# Patient Record
Sex: Male | Born: 1960 | Race: White | Hispanic: No | Marital: Married | Smoking: Former smoker
Health system: Southern US, Community
[De-identification: ages and names within clinical notes are randomized; demographics above are authoritative.]

## PROBLEM LIST (undated history)

## (undated) DIAGNOSIS — S2220XA Unspecified fracture of sternum, initial encounter for closed fracture: Secondary | ICD-10-CM

## (undated) DIAGNOSIS — M1990 Unspecified osteoarthritis, unspecified site: Secondary | ICD-10-CM

## (undated) DIAGNOSIS — K859 Acute pancreatitis without necrosis or infection, unspecified: Secondary | ICD-10-CM

## (undated) DIAGNOSIS — E119 Type 2 diabetes mellitus without complications: Secondary | ICD-10-CM

## (undated) DIAGNOSIS — R569 Unspecified convulsions: Secondary | ICD-10-CM

## (undated) DIAGNOSIS — K5792 Diverticulitis of intestine, part unspecified, without perforation or abscess without bleeding: Secondary | ICD-10-CM

## (undated) HISTORY — DX: Unspecified osteoarthritis, unspecified site: M19.90

## (undated) HISTORY — DX: Type 2 diabetes mellitus without complications: E11.9

## (undated) HISTORY — DX: Unspecified convulsions: R56.9

## (undated) HISTORY — DX: Diverticulitis of intestine, part unspecified, without perforation or abscess without bleeding: K57.92

## (undated) HISTORY — DX: Acute pancreatitis without necrosis or infection, unspecified: K85.90

---
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
## 2005-04-28 DIAGNOSIS — K859 Acute pancreatitis without necrosis or infection, unspecified: Secondary | ICD-10-CM

## 2005-04-28 HISTORY — DX: Acute pancreatitis without necrosis or infection, unspecified: K85.90

## 2008-12-15 ENCOUNTER — Ambulatory Visit: Payer: Self-pay | Admitting: General Surgery

## 2008-12-15 HISTORY — PX: COLONOSCOPY: SHX174

## 2010-10-03 ENCOUNTER — Emergency Department: Payer: Self-pay | Admitting: Unknown Physician Specialty

## 2012-10-01 ENCOUNTER — Ambulatory Visit: Payer: Self-pay | Admitting: Internal Medicine

## 2012-10-06 ENCOUNTER — Ambulatory Visit (INDEPENDENT_AMBULATORY_CARE_PROVIDER_SITE_OTHER): Payer: Managed Care, Other (non HMO) | Admitting: General Surgery

## 2012-10-06 ENCOUNTER — Encounter: Payer: Self-pay | Admitting: General Surgery

## 2012-10-06 VITALS — Ht 70.0 in | Wt 190.0 lb

## 2012-10-06 DIAGNOSIS — K819 Cholecystitis, unspecified: Secondary | ICD-10-CM

## 2012-10-06 NOTE — Progress Notes (Signed)
Patient ID: Ryan Dickerson, male   DOB: 06-20-1960, 52 y.o.   MRN: [id number]  Chief Complaint  Patient presents with  . Abdominal Pain    gall bladder    HPI Ryan Dickerson is a 52 y.o. male.  Patient here today for gall bladder evaluation referred by Dr. Arlana Pouch.  Ultrasound and labs done 10-01-12 and they say "I have a stone or polyp in common bile duct".  Noticed that last week he was having some constant abdominal pain in right upper and mid abdomen area.  Not associated with meals. He does admit to having some nausea but no vomiting. May have had an episode similar to this in past.  Does state that he had about 7-8 years ago pancreatitis. At that time no apparent cause noted. HPI  Past Medical History  Diagnosis Date  . Pancreatitis 2007  . Diabetes mellitus without complication     Past Surgical History  Procedure Laterality Date  . 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 Colonoscopy  12-15-08    Dr Lemar Livings    Family History  Problem Relation Age of Onset  . Diabetes Father   . Stroke Father     Social History History  Substance Use Topics  . Smoking status: Current Every Day Smoker -- 0.50 packs/day for 30 years    Types: Cigarettes  . Smokeless tobacco: Never Used  . Alcohol Use: No    No Known Allergies  Current Outpatient Prescriptions  Medication Sig Dispense Refill  . aspirin 81 MG tablet Take 81 mg by mouth daily.      . clonazePAM (KLONOPIN) 1 MG tablet Take 1 mg by mouth daily.      . fish oil-omega-3 fatty acids 1000 MG capsule Take 2 g by mouth daily.      . metFORMIN (GLUMETZA) 500 MG (MOD) 24 hr tablet Take 500 mg by mouth daily with breakfast.       No current facility-administered medications for this visit.    Review of Systems Review of Systems  Constitutional: Negative.   Respiratory: Negative.   Cardiovascular: Negative.   Gastrointestinal: Positive for nausea and abdominal pain.    Height 5\' 10"  (1.778 m), weight 190 lb (86.183  kg).  Physical Exam Physical Exam  Constitutional: He is oriented to person, place, and time. He appears well-developed.  Eyes: Conjunctivae are normal. No scleral icterus.  Neck: Neck supple.  Cardiovascular: Normal rate, regular rhythm and normal heart sounds.   Pulmonary/Chest: Breath sounds normal.  Abdominal: Bowel sounds are normal.  Neurological: He is oriented to person, place, and time.    Data Reviewed U/S reviewed- there is a echodense 1cm polyp vs adherent stone in neck of gb. Also a layering of mild echodense material seen.  Assessment    Pt with prior episode of pancreatitis, current symptoms of rt uq pain, nausea and US showing poss stones. Feel he will benefit with lap/cholecystectomy. Discussed fully with pt, including risks and benfits, Pt is agreeable     Plan          Patient's surgery has been scheduled for 10-11-12 at [facility]. It is okay for patient to continue 81 mg aspirin.   Ryan Dickerson M 10/06/2012, 4:05 PM

## 2012-10-06 NOTE — Patient Instructions (Addendum)
Cholecystostomy  The gallbladder is a pear-shaped organ that lies beneath the liver on the right side of the body. The gallbladder stores bile, a fluid that helps the body digest fats. However, sometimes bile and other fluids build up in the gallbladder because of an obstruction (for example, a gallstones). This can cause fever, pain, swelling, nausea and other serious symptoms. The procedure used to drain these fluids is called a cholecystostomy. A tube is inserted into the gallbladder. Fluid drains through the tube into a plastic bag outside the body. This procedure is usually done on people who are admitted to the hospital. The procedure is often recommended for people who cannot have gallbladder surgery right away, usually because they are too ill to make it through surgery. The cholecystostomy tube is usually temporary, until surgery can be done. RISKS AND COMPLICATIONS Although rare, complications can include:  Clogging of the tube.  Infection in or around the drain site. Antibiotics might be prescribed for the infection. Or, another tube might be inserted to drain the infected fluid.  Internal bleeding from the liver. BEFORE THE PROCEDURE   Try to quit smoking several weeks before the procedure. Smoking can slow healing.  Arrange for someone to drive you home from the hospital.  Right before your procedure, avoid all foods and liquids after midnight. This includes coffee, tea and water.  On the day of the procedure, arrive early to fill out all the paperwork. PROCEDURE You will be given a sedative to make you sleepy and a local anesthetic to numb the skin. Next, a small cut is made in the abdomen. Then a tube is threaded through the cut into the gallbladder. The procedure is usually done with ultrasound to guide the tube into the gallbladder. Once the tube is in place, the drain is secured to the skin with a stitch. The tube is then connected to a drainage bag.  AFTER THE PROCEDURE    People who have a cholecystostomy usually stay in the hospital for several days because they are so ill. You might not be able to eat for the first few days. Instead, you will be connected to an IV for fluids and nutrients.  The procedure does not cure the blockage that caused the fluid to build up in the first place. Because of this, the gallbladder will need to be removed in the future. The drain is removed at that time. HOME CARE INSTRUCTIONS  Be sure to follow your healthcare provider's instructions carefully. You may shower but avoid tub baths and swimming until your caregiver says it is OK. Eat and drink according to the directions you have been given. And be sure to make all follow-up appointments.  Call your healthcare provider if you notice new pain, redness or swelling around the wound. SEEK IMMEDIATE MEDICAL CARE IF:   There is increased abdominal pain.  Nausea or vomiting occurs.  You develop a fever.  The drainage tube comes out of the abdomen. Document Released: 07/11/2008 Document Revised: 07/07/2011 Document Reviewed: 07/11/2008 [facility] Patient Information 2014 [place], [place].  Patient's surgery has been scheduled for 10-11-12 at [facility]. It is okay for patient to continue 81 mg aspirin.

## 2012-10-11 ENCOUNTER — Encounter: Payer: Self-pay | Admitting: General Surgery

## 2012-10-11 ENCOUNTER — Ambulatory Visit: Payer: Self-pay | Admitting: General Surgery

## 2012-10-11 DIAGNOSIS — K8010 Calculus of gallbladder with chronic cholecystitis without obstruction: Secondary | ICD-10-CM

## 2012-10-11 HISTORY — PX: CHOLECYSTECTOMY: SHX55

## 2012-10-11 LAB — BASIC METABOLIC PANEL
Chloride: 108 mmol/L — ABNORMAL HIGH (ref 98–107)
EGFR (Non-African Amer.): >60
Glucose: 157 mg/dL — ABNORMAL HIGH (ref 65–99)

## 2012-10-13 ENCOUNTER — Encounter: Payer: Self-pay | Admitting: General Surgery

## 2012-10-20 ENCOUNTER — Ambulatory Visit (INDEPENDENT_AMBULATORY_CARE_PROVIDER_SITE_OTHER): Payer: Managed Care, Other (non HMO) | Admitting: General Surgery

## 2012-10-20 ENCOUNTER — Encounter: Payer: Self-pay | Admitting: General Surgery

## 2012-10-20 VITALS — BP 128/74 | HR 88 | Resp 16 | Ht 69.0 in | Wt 186.0 lb

## 2012-10-20 DIAGNOSIS — K8010 Calculus of gallbladder with chronic cholecystitis without obstruction: Secondary | ICD-10-CM | POA: Insufficient documentation

## 2012-10-20 NOTE — Progress Notes (Signed)
  Patient ID: Ryan Dickerson, male   DOB: 1960/10/12, 52 y.o.   MRN: [id number]  Chief Complaint  Patient presents with  . Routine Post Op    cholecystectomy    HPI Ryan Dickerson is a 52 y.o. male.  Patient here today for his port op visit lap cholecystectomy done 10-11-12.  Soreness is improving per patient. HPI  Past Medical History  Diagnosis Date  . Pancreatitis 2007  . Diabetes mellitus without complication     Past Surgical History  Procedure Laterality Date  . 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 Colonoscopy  12-15-08    Dr Lemar Livings  . Cholecystectomy  10-11-12    Family History  Problem Relation Age of Onset  . Diabetes Father   . Stroke Father     Social History History  Substance Use Topics  . Smoking status: Current Every Day Smoker -- 0.50 packs/day for 30 years    Types: Cigarettes  . Smokeless tobacco: Never Used  . Alcohol Use: No    No Known Allergies  Current Outpatient Prescriptions  Medication Sig Dispense Refill  . aspirin 81 MG tablet Take 81 mg by mouth daily.      . clonazePAM (KLONOPIN) 1 MG tablet Take 1 mg by mouth daily.      . fish oil-omega-3 fatty acids 1000 MG capsule Take 2 g by mouth daily.      . metFORMIN (GLUMETZA) 500 MG (MOD) 24 hr tablet Take 500 mg by mouth daily with breakfast.       No current facility-administered medications for this visit.    Review of Systems Review of Systems  Constitutional: Negative.   Respiratory: Negative.   Cardiovascular: Negative.     Blood pressure 128/74, pulse 88, resp. rate 16, height 5\' 9"  (1.753 m), weight 186 lb (84.369 kg).  Physical Exam Physical Exam  Constitutional: He is oriented to person, place, and time. He appears well-developed and well-nourished.  Abdominal: Soft.  Neurological: He is alert and oriented to person, place, and time.  Skin: Skin is warm and dry.  Portsites clean.  Data Reviewed none  Assessment    Stable, port sites healing.    Plan    Follow  up as needed.       SANKAR,SEEPLAPUTHUR G 10/22/2012, 4:45 PM

## 2012-10-20 NOTE — Patient Instructions (Addendum)
May return to work. Increase activity as tolerated.

## 2012-10-22 ENCOUNTER — Encounter: Payer: Self-pay | Admitting: General Surgery

## 2014-04-06 ENCOUNTER — Emergency Department: Payer: Self-pay | Admitting: Internal Medicine

## 2014-08-18 NOTE — Op Note (Signed)
PATIENT NAME:  Ryan Dickerson, Ryan Dickerson MR#:  [id number] DATE OF BIRTH:  [birth year]  DATE OF PROCEDURE:  10/11/2012  PREOPERATIVE DIAGNOSIS: Chronic cholecystitis; cholelithiasis.   POSTOPERATIVE DIAGNOSIS: Chronic cholecystitis; cholelithiasis.   OPERATION: Laparoscopy; cholecystectomy with intraoperative cholangiogram.   SURGEON: Kathreen CosierS. G. Sankar, M.D.   ANESTHESIA: General.   COMPLICATIONS: None.   ESTIMATED BLOOD LOSS: Less than 25 mL.   DRAINS: None,   PROCEDURE: The patient was put to sleep in the supine position on the operating table. The abdomen was prepped and draped out as a sterile field. Time-out was performed. A small incision was made below the umbilicus and the Veress needle with the Inner Dyne sleeve positioned in the peritoneal cavity and verified with the hanging-drop method. Pneumoperitoneum was obtained and a 10-mm port was placed. The camera was introduced, with good visualization of the peritoneal cavity. No apparent injury to the underlying structures were noted from the initial entry. Epigastric and 2 lateral 5 mm ports were placed. The liver appeared normal. The gallbladder was noted to be mildly distended but with no acute changes. There was mild thickening in its wall. There were no adhesions surrounding this, however at the side of the Hartmann's pouch and cystic duct area there was significant scarring and some thickening of the fatty tissue. Dissection was performed with cephalad retraction of the gallbladder, and the cystic duct was identified and was noted to be somewhat short. This was first freed. The Kumar clamp and catheter were positioned and cholangiogram was performed. It seemed that the cystic duct was connecting perhaps on the right hepatic since the left hepatic duct did not fill completely. The bile duct otherwise filled fully and emptied promptly into the duodenum. There did not appear to be any filling defects. The catheter was used to decompress the gallbladder  and removed. The cystic duct was then Hemoclipped and cut. The gallbladder was lifted up, to expose out the cystic artery which was then dissected out by going into the gallbladder, and the duct side was Hemoclipped and cut and the gallbladder was dissected free from its bed using cautery for control of bleeding. After ensuring that the staples and the clips were in place and there was no bleeding the area was irrigated with a small amount of fluid and suctioned out. A 5 mm scope was then utilized through the epigastric port site, the gallbladder was placed in an a retrieval bag and brought out through the umbilical port site. On the opening there appeared to contain tiny cholesterol stones and multiple crystals lining the lumen of the gallbladder.   The umbilical port site was then closed with placement of a 0 Vicryl stitch through a suture-passer and tied down. Pneumoperitoneum was released and the remaining ports were removed. All of the skin incisions were closed with subcuticular 4-0 Vicryl, reinforced with Steri-Strips. A dry sterile dressing was placed. The patient tolerated the procedure well. No immediate problems were encountered. He was subsequently extubated and returned to the recovery room in stable condition.     ____________________________ S.Wynona LunaG. Sankar, MD sgs:dm D: 10/11/2012 11:02:10 ET T: 10/11/2012 11:21:19 ET JOB#: [id number]  cc: S.G. Evette CristalSankar, MD, <Dictator> [facility] Wynona LunaG SANKAR MD ELECTRONICALLY SIGNED 10/11/2012 11:52

## 2015-01-23 ENCOUNTER — Encounter: Payer: Self-pay | Admitting: *Deleted

## 2015-01-23 ENCOUNTER — Encounter: Payer: 59 | Attending: Internal Medicine | Admitting: *Deleted

## 2015-01-23 VITALS — BP 110/68 | Ht 69.0 in | Wt 203.1 lb

## 2015-01-23 DIAGNOSIS — E119 Type 2 diabetes mellitus without complications: Secondary | ICD-10-CM | POA: Diagnosis present

## 2015-01-23 NOTE — Progress Notes (Signed)
Diabetes Self-Management Education  Visit Type: First/Initial  Appt. Start Time: 0905 Appt. End Time: 1020  01/23/2015  [name], identified by name and date of birth, is a 54 y.o. male with a diagnosis of Diabetes: Type 2.   ASSESSMENT  Blood pressure 110/68, height  (1.753 m), weight 203 lb 1.6 oz (92.126 kg). Body mass index is 29.98 kg/(m^2).      Diabetes Self-Management Education - 01/23/15 1149    Visit Information   Visit Type First/Initial   Initial Visit   Diabetes Type Type 2   Are you currently following a meal plan? No   Are you taking your medications as prescribed? Yes   Date Diagnosed 15 yrs ago   Psychosocial Assessment   Patient Belief/Attitude about Diabetes Afraid  Pt reports he became "nervous" when his A1C increased to 11 %.   Self-care barriers None   Self-management support Family;Doctor's office;Friends   Patient Concerns Nutrition/Meal planning;Monitoring;Problem Solving;Weight Control   Special Needs None   Preferred Learning Style Auditory;Hands on   Learning Readiness Ready   How often do you need to have someone help you when you read instructions, pamphlets, or other written materials from your doctor or pharmacy? 1 - Never   What is the last grade level you completed in school? some college   Complications   Last HgB A1C per patient/outside source 8 %  Pt reports 8 % in August but 11.7 % in May.   How often do you check your blood sugar? 1-2 times/day   Fasting Blood glucose range (mg/dL) 161-096  Pt reports last week FBG's 180's mg/dL which has improved from 200's mg/dL    Have you had a dilated eye exam in the past 12 months? No  appt next week   Have you had a dental exam in the past 12 months? Yes  appt 01/26/15   Are you checking your feet? Yes   How many days per week are you checking your feet? 1   Dietary Intake   Breakfast 1/2 sandwich or biscuit from fast food    Snack (morning) peanut butter crackers   Lunch  burgers, sandwich. left overs   Snack (afternoon) 1/2 sandwich, pizza   Dinner chicken or meatloaf, corn, mac n cheese, spaghetti   Snack (evening) ice cream   Beverage(s) water, diet soda, fruit juice   Exercise   Exercise Type ADL's   Patient Education   Previous Diabetes Education Yes (please comment)  2001   Disease state  Definition of diabetes, type 1 and 2, and the diagnosis of diabetes;Factors that contribute to the development of diabetes   Nutrition management  Role of diet in the treatment of diabetes and the relationship between the three main macronutrients and blood glucose level;Food label reading, portion sizes and measuring food.   Physical activity and exercise  Role of exercise on diabetes management, blood pressure control and cardiac health.   Medications Reviewed insulin injection, site rotation, insulin storage and needle disposal.;Reviewed patients medication for diabetes, action, purpose, timing of dose and side effects.   Monitoring Purpose and frequency of SMBG.;Identified appropriate SMBG and/or A1C goals.   Acute complications Taught treatment of hypoglycemia - the 15 rule.   Chronic complications Relationship between chronic complications and blood glucose control   Psychosocial adjustment Identified and addressed patients feelings and concerns about diabetes   Individualized Goals (developed by patient)   Improving health Improve blood sugars Prevent diabetes complications Lose weight Become more fit  Outcomes   Expected Outcomes Demonstrated interest in learning. Expect positive outcomes      Individualized Plan for Diabetes Self-Management Training:   Learning Objective:  Patient will have a greater understanding of diabetes self-management. Patient education plan is to attend individual and/or group sessions per assessed needs and concerns.   Plan:   Patient Instructions  Check blood sugars 1 x day before breakfast or 2 hrs after supper every  day Exercise: Begin walking  for 15 minutes  3  days a week and gradually increase to 30 minutes 5 x week  Avoid sugar sweetened drinks (juices) unless treating a low blood sugar Eat 3 meals day,  2  snacks a day Space meals 4-6 hours apart Limit desserts and sweets Complete 3 Day Food Record and bring to next appt Bring blood sugar records to the next appointment Carry fast acting glucose and a snack at all times Rotate injection sites  Expected Outcomes:  Demonstrated interest in learning. Expect positive outcomes  Education material provided:  General Meal Planning Guidelines Site selection 3 Day Food Record Glucose tablets Symptoms, causes and treatments of Hypoglycemia  If problems or questions, patient to contact team via:  Sharion Settler, RN, CCM, CDE [phone]  Future DSME appointment:  Friday February 09, 2015 at 9:00 am with [facility] (dietitian)

## 2015-01-23 NOTE — Patient Instructions (Addendum)
Check blood sugars 1 x day before breakfast or 2 hrs after supper every day Exercise: Begin walking  for 15 minutes  3  days a week and gradually increase to 30 minutes 5 x week  Avoid sugar sweetened drinks (juices) unless treating a low blood sugar Eat 3 meals day,  2  snacks a day Space meals 4-6 hours apart Limit desserts and sweets Complete 3 Day Food Record and bring to next appt Bring blood sugar records to the next appointment Carry fast acting glucose and a snack at all times Rotate injection sites

## 2015-02-09 ENCOUNTER — Ambulatory Visit: Payer: 59 | Admitting: Dietician

## 2015-02-22 ENCOUNTER — Telehealth: Payer: Self-pay | Admitting: Dietician

## 2015-02-22 NOTE — Telephone Encounter (Signed)
Called patient to rescheduled cancelled appointment from 02/09/15. Left message for him to call back to either reschedule or cancel.

## 2015-03-16 ENCOUNTER — Encounter: Payer: Self-pay | Admitting: Dietician

## 2015-03-16 NOTE — Progress Notes (Signed)
Have not heard from patient to reschedule missed appointment. Sent discharge letter to MD. 

## 2015-09-25 ENCOUNTER — Emergency Department
Admission: EM | Admit: 2015-09-25 | Discharge: 2015-09-25 | Disposition: A | Discharge status: Home / Self Care | Payer: 59 | Attending: Emergency Medicine | Admitting: Emergency Medicine

## 2015-09-25 ENCOUNTER — Emergency Department: Payer: 59

## 2015-09-25 ENCOUNTER — Encounter: Payer: Self-pay | Admitting: Medical Oncology

## 2015-09-25 DIAGNOSIS — Y9389 Activity, other specified: Secondary | ICD-10-CM | POA: Diagnosis not present

## 2015-09-25 DIAGNOSIS — Y929 Unspecified place or not applicable: Secondary | ICD-10-CM | POA: Insufficient documentation

## 2015-09-25 DIAGNOSIS — Z79899 Other long term (current) drug therapy: Secondary | ICD-10-CM | POA: Insufficient documentation

## 2015-09-25 DIAGNOSIS — S5002XA Contusion of left elbow, initial encounter: Secondary | ICD-10-CM

## 2015-09-25 DIAGNOSIS — Z794 Long term (current) use of insulin: Secondary | ICD-10-CM | POA: Insufficient documentation

## 2015-09-25 DIAGNOSIS — Z87891 Personal history of nicotine dependence: Secondary | ICD-10-CM | POA: Diagnosis not present

## 2015-09-25 DIAGNOSIS — Z7984 Long term (current) use of oral hypoglycemic drugs: Secondary | ICD-10-CM | POA: Insufficient documentation

## 2015-09-25 DIAGNOSIS — Y999 Unspecified external cause status: Secondary | ICD-10-CM | POA: Insufficient documentation

## 2015-09-25 DIAGNOSIS — W010XXA Fall on same level from slipping, tripping and stumbling without subsequent striking against object, initial encounter: Secondary | ICD-10-CM | POA: Insufficient documentation

## 2015-09-25 DIAGNOSIS — E119 Type 2 diabetes mellitus without complications: Secondary | ICD-10-CM | POA: Diagnosis not present

## 2015-09-25 DIAGNOSIS — M79662 Pain in left lower leg: Secondary | ICD-10-CM | POA: Diagnosis present

## 2015-09-25 DIAGNOSIS — S8012XA Contusion of left lower leg, initial encounter: Secondary | ICD-10-CM | POA: Diagnosis not present

## 2015-09-25 DIAGNOSIS — Z7982 Long term (current) use of aspirin: Secondary | ICD-10-CM | POA: Insufficient documentation

## 2015-09-25 DIAGNOSIS — S8002XA Contusion of left knee, initial encounter: Secondary | ICD-10-CM

## 2015-09-25 DIAGNOSIS — Z791 Long term (current) use of non-steroidal anti-inflammatories (NSAID): Secondary | ICD-10-CM | POA: Diagnosis not present

## 2015-09-25 HISTORY — DX: Unspecified fracture of sternum, initial encounter for closed fracture: S22.20XA

## 2015-09-25 MED ORDER — NAPROXEN 500 MG PO TABS: 500.0000 mg | ORAL_TABLET | Freq: Two times a day (BID) | ORAL | Status: DC

## 2015-09-25 NOTE — Discharge Instructions (Signed)
Elbow Contusion An elbow contusion is a deep bruise of the elbow. Contusions are the result of an injury that caused bleeding under the skin. The contusion may turn blue, purple, or yellow. Minor injuries will give you a painless contusion, but more severe contusions may stay painful and swollen for a few weeks.  CAUSES  An elbow contusion comes from a direct force to that area, such as falling on the elbow. SYMPTOMS   Swelling and redness of the elbow.  Bruising of the elbow area.  Tenderness or soreness of the elbow. DIAGNOSIS  You will have a physical exam and will be asked about your history. You may need an X-ray of your elbow to look for a broken bone (fracture).  TREATMENT  A sling or splint may be needed to support your injury. Resting, elevating, and applying cold compresses to the elbow area are often the best treatments for an elbow contusion. Over-the-counter medicines may also be recommended for pain control. HOME CARE INSTRUCTIONS   Put ice on the injured area.  Put ice in a plastic bag.  Place a towel between your skin and the bag.  Leave the ice on for 15-20 minutes, 03-04 times a day.  Only take over-the-counter or prescription medicines for pain, discomfort, or fever as directed by your caregiver.  Rest your injured elbow until the pain and swelling are better.  Elevate your elbow to reduce swelling.  Apply a compression wrap as directed by your caregiver. This can help reduce swelling and motion. You may remove the wrap for sleeping, showers, and baths. If your fingers become numb, cold, or blue, take the wrap off and reapply it more loosely.  Use your elbow only as directed by your caregiver. You may be asked to do range of motion exercises. Do them as directed.  See your caregiver as directed. It is very important to keep all follow-up appointments in order to avoid any long-term problems with your elbow, including chronic pain or inability to move your elbow  normally. SEEK IMMEDIATE MEDICAL CARE IF:   You have increased redness, swelling, or pain in your elbow.  Your swelling or pain is not relieved with medicines.  You have swelling of the hand and fingers.  You are unable to move your fingers or wrist.  You begin to lose feeling in your hand or fingers.  Your fingers or hand become cold or blue. MAKE SURE YOU:   Understand these instructions.  Will watch your condition.  Will get help right away if you are not doing well or get worse.   This information is not intended to replace advice given to you by your health care provider. Make sure you discuss any questions you have with your health care provider.   Document Released: 03/23/2006 Document Revised: 07/07/2011 Document Reviewed: 11/27/2014 Elsevier Interactive Patient Education 2016 [employer]  Cryotherapy Cryotherapy is when you put ice on your injury. Ice helps lessen pain and puffiness (swelling) after an injury. Ice works the best when you start using it in the first 24 to 48 hours after an injury. HOME CARE  Put a dry or damp towel between the ice pack and your skin.  You may press gently on the ice pack.  Leave the ice on for no more than 10 to 20 minutes at a time.  Check your skin after 5 minutes to make sure your skin is okay.  Rest at least 20 minutes between ice pack uses.  Stop using ice  when your skin loses feeling (numbness).  Do not use ice on someone who cannot tell you when it hurts. This includes small children and people with memory problems (dementia). GET HELP RIGHT AWAY IF:  You have white spots on your skin.  Your skin turns blue or pale.  Your skin feels waxy or hard.  Your puffiness gets worse. MAKE SURE YOU:   Understand these instructions.  Will watch your condition.  Will get help right away if you are not doing well or get worse.   This information is not intended to replace advice given to you by your health care  provider. Make sure you discuss any questions you have with your health care provider.   Document Released: 10/01/2007 Document Revised: 07/07/2011 Document Reviewed: 12/05/2010 Elsevier Interactive Patient Education [employer]

## 2015-09-25 NOTE — ED Notes (Signed)
Pt reports that he tripped and fell this am and hurt his left elbow and left knee.

## 2015-09-25 NOTE — ED Notes (Signed)
See triage noted   States he tripped and fell this am  Having pain to left elbow and knee  No deformity noted

## 2015-09-25 NOTE — ED Provider Notes (Signed)
[facility] Emergency Department Provider Note  ____________________________________________  Time seen: Approximately 11:47 AM  I have reviewed the triage vital signs and the nursing notes.   HISTORY  Chief Complaint Fall    HPI Ryan Dickerson is a 55 y.o. male , NAD, presents to the emergency Department with left elbow, left knee pain and tingling about his left lower extremity 2 weeks. States he tripped and fell 2 weeks ago while holding a child. States he skinned his left knee and had an area of swelling that is slowly healing. Has been able to ambulate and use his upper extremities without any pain or loss of function. Notes that over the last few days he has felt some tingling in his left calf but denies any pain. Has not noted any skin sores, redness, swelling, warmth to his skin. Denies any cardiovascular history nor vascular history. Has not noted any skin sores about his lower extremities. Notes that most of his muscular or skeletal pain is about the left upper lower leg. Denies fever, chills, body aches. Has not had any chest pain, shortness of breath nor back pain. Denies head injury, LOC, dizziness, headaches.   Past Medical History  Diagnosis Date  . Pancreatitis 2007  . Diabetes mellitus without complication (HCC)   . Sternal fracture     Patient Active Problem List   Diagnosis Date Noted  . Calculus of gallbladder with other cholecystitis, without mention of obstruction 10/20/2012  . Cholecystitis without calculus 10/06/2012    Past Surgical History  Procedure Laterality Date  . 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 Colonoscopy  12-15-08    Dr Lemar Livings  . Cholecystectomy  10-11-12    Current Outpatient Rx  Name  Route  Sig  Dispense  Refill  . acetaminophen (TYLENOL) 325 MG tablet   Oral   Take 650 mg by mouth every 6 (six) hours as needed.         Marland Kitchen aspirin 81 MG tablet   Oral   Take 81 mg by mouth daily.         . clonazePAM  (KLONOPIN) 1 MG tablet   Oral   Take 1 mg by mouth daily.         Marland Kitchen ibuprofen (ADVIL,MOTRIN) 800 MG tablet   Oral   Take 800 mg by mouth every 8 (eight) hours as needed.         . Insulin Glargine (TOUJEO SOLOSTAR) 300 UNIT/ML SOPN   Subcutaneous   Inject 45 Units into the skin at bedtime.         Marland Kitchen lisinopril (PRINIVIL,ZESTRIL) 5 MG tablet   Oral   Take 5 mg by mouth daily.         . metFORMIN (GLUCOPHAGE) 500 MG tablet   Oral   Take 1,000 mg by mouth 2 (two) times daily.         . naproxen (NAPROSYN) 500 MG tablet   Oral   Take 1 tablet (500 mg total) by mouth 2 (two) times daily with a meal.   14 tablet   0   . Omega 3-6-9 CAPS   Oral   Take 1,200 mg by mouth 2 (two) times daily.         . pioglitazone (ACTOS) 30 MG tablet   Oral   Take 30 mg by mouth daily.           Allergies Review of patient's allergies indicates no known allergies.  Family History  Problem Relation Age of Onset  . Diabetes Father   . Stroke Father     Social History Social History  Substance Use Topics  . Smoking status: Former Smoker -- 0.50 packs/day for 30 years    Types: Cigarettes    Quit date: 04/28/2013  . Smokeless tobacco: Never Used  . Alcohol Use: No     Review of Systems  Constitutional: No fever/chills, fatigue Eyes: No visual changes.  Cardiovascular: No chest pain. Respiratory: No cough. No shortness of breath. No wheezing.  Gastrointestinal: No abdominal pain.  No nausea, vomiting.   Musculoskeletal: Positive left knee, left elbow, left lower leg pain. Negative for back pain.  Skin: Abrasion left knee. Negative for rash, redness, swelling, warmth, skin sores, open wounds or bruising. Neurological: Positive tingling left calf. Negative for headaches, focal weakness or numbness. No LOC, dizziness. 10-point ROS otherwise negative.  ____________________________________________   PHYSICAL EXAM:  VITAL SIGNS: ED Triage Vitals  Enc Vitals Group      BP 09/25/15 1048 121/98 mmHg     Pulse Rate 09/25/15 1048 88     Resp 09/25/15 1048 16     Temp 09/25/15 1048 98 F (36.7 C)     Temp Source 09/25/15 1048 Oral     SpO2 09/25/15 1048 97 %     Weight 09/25/15 1048 219 lb (99.338 kg)     Height 09/25/15 1048 5\' 9"  (1.753 m)     Head Cir --      Peak Flow --      Pain Score 09/25/15 1048 6     Pain Loc --      Pain Edu? --      Excl. in GC? --      Constitutional: Alert and oriented. Well appearing and in no acute distress. Eyes: Conjunctivae are normal.  Head: Atraumatic. Cardiovascular: Good peripheral circulation with 2+ pulses noted in the left lower extremity. Capillary refill is brisk in the left lower extremity. Respiratory: Normal respiratory effort without tachypnea or retractions.  Musculoskeletal: Full range of motion of the left shoulder, elbow, wrist, hand, fingers without pain. No bony abnormalities or crepitus to palpation of these areas. Left knee without laxity with anterior and posterior drawer. Left knee without laxity with varus or valgus stress. No pain with patellofemoral grind. No swelling or mass noted about palpation of the posterior knee. Full range of motion of the left knee without pain. No lower extremity tenderness nor edema.  No joint effusions. Neurologic:  Normal speech and language. No gross focal neurologic deficits are appreciated. Sensation to light touch grossly intact about the left lower extremity. Skin:  Superficial, healing abrasion noted to the anterior, inferior portion of the left knee. Skin is cool about the bilateral feet and toes and are equally clear bilaterally. Here is missing about the lower third of bilateral lower extremities with mild sheen to the skin. No skin sores nor open wounds are noted. Skin is warm, dry. No rash noted. Psychiatric: Mood and affect are normal. Speech and behavior are normal. Patient exhibits appropriate insight and  judgement.   ____________________________________________   LABS  None ____________________________________________  EKG  None ____________________________________________  RADIOLOGY I have personally viewed and evaluated these images (plain radiographs) as part of my medical decision making, as well as reviewing the written report by the radiologist.  Dg Elbow 2 Views Left  09/25/2015  CLINICAL DATA:  Fall 2 weeks ago with lateral elbow pain, initial encounter EXAM: LEFT  ELBOW - 2 VIEW COMPARISON:  None. FINDINGS: Mild irregularity of the proximal radial head is noted likely of a degenerative nature as no joint effusion is seen. No soft tissue abnormality is noted. IMPRESSION: Degenerative change without acute abnormality. Electronically Signed   By: Alcide Clever M.D.   On: 09/25/2015 12:23   Dg Tibia/fibula Left  09/25/2015  CLINICAL DATA:  Fall 2 weeks ago onto left side with pain at the proximal tibia. Initial encounter. EXAM: LEFT TIBIA AND FIBULA - 2 VIEW COMPARISON:  None. FINDINGS: Negative for acute fracture or malalignment. No erosion or focal lesion. Negative for joint effusion at the knee. Scattered enthesophytes and mild degenerative tibial spine peaking. IMPRESSION: No acute finding. Electronically Signed   By: Marnee Spring M.D.   On: 09/25/2015 12:17    ____________________________________________    PROCEDURES  Procedure(s) performed: None      Medications - No data to display   ____________________________________________   INITIAL IMPRESSION / ASSESSMENT AND PLAN / ED COURSE  Pertinent labs & imaging results that were available during my care of the patient were reviewed by me and considered in my medical decision making (see chart for details). While the patient was in the x-ray suite he asked that the left knee x-ray not be completed and change to a left tib-fib as his pain was in the upper or lower leg. I complied with this request.  Patient's  diagnosis is consistent with contusion of left knee and left elbow due to fall. Patient's left knee was placed in an Ace wrap for stability. Patient will be discharged home with prescriptions for naproxen to take as directed. Patient is to follow up with his primary care provider for further evaluation and treatment of changes of skin sensation about the left lower extremity. Patient was reassured that his physical exam was not indicative of blood clot or impingement at this time. Patient is given ED precautions to return to the ED for any worsening or new symptoms.    ____________________________________________  FINAL CLINICAL IMPRESSION(S) / ED DIAGNOSES  Final diagnoses:  Contusion of left knee and lower leg, initial encounter  Contusion of left elbow, initial encounter      NEW MEDICATIONS STARTED DURING THIS VISIT:  Discharge Medication List as of 09/25/2015 12:35 PM    START taking these medications   Details  naproxen (NAPROSYN) 500 MG tablet Take 1 tablet (500 mg total) by mouth 2 (two) times daily with a meal., Starting 09/25/2015, Until Discontinued, Print             Hope Pigeon, PA-C 09/25/15 1710  Richardean Canal, MD 09/25/15 2155

## 2017-10-08 ENCOUNTER — Ambulatory Visit
Admission: RE | Admit: 2017-10-08 | Discharge: 2017-10-08 | Disposition: A | Discharge status: Home / Self Care | Payer: Managed Care, Other (non HMO) | Source: Ambulatory Visit | Attending: Internal Medicine | Admitting: Internal Medicine

## 2017-10-08 ENCOUNTER — Other Ambulatory Visit: Payer: Self-pay | Admitting: Internal Medicine

## 2017-10-08 DIAGNOSIS — Z87891 Personal history of nicotine dependence: Secondary | ICD-10-CM | POA: Diagnosis not present

## 2017-10-08 DIAGNOSIS — R059 Cough, unspecified: Secondary | ICD-10-CM

## 2017-10-08 DIAGNOSIS — R05 Cough: Secondary | ICD-10-CM

## 2018-06-21 ENCOUNTER — Other Ambulatory Visit: Payer: Self-pay

## 2018-06-21 ENCOUNTER — Encounter: Payer: Self-pay | Admitting: Emergency Medicine

## 2018-06-21 DIAGNOSIS — Z794 Long term (current) use of insulin: Secondary | ICD-10-CM | POA: Diagnosis not present

## 2018-06-21 DIAGNOSIS — Z7982 Long term (current) use of aspirin: Secondary | ICD-10-CM | POA: Diagnosis not present

## 2018-06-21 DIAGNOSIS — E119 Type 2 diabetes mellitus without complications: Secondary | ICD-10-CM | POA: Insufficient documentation

## 2018-06-21 DIAGNOSIS — Z79899 Other long term (current) drug therapy: Secondary | ICD-10-CM | POA: Insufficient documentation

## 2018-06-21 DIAGNOSIS — R319 Hematuria, unspecified: Secondary | ICD-10-CM | POA: Diagnosis not present

## 2018-06-21 DIAGNOSIS — Z87891 Personal history of nicotine dependence: Secondary | ICD-10-CM | POA: Diagnosis not present

## 2018-06-21 DIAGNOSIS — R1031 Right lower quadrant pain: Secondary | ICD-10-CM | POA: Insufficient documentation

## 2018-06-21 LAB — CBC WITH DIFFERENTIAL/PLATELET
Abs Immature Granulocytes: 0.02 10*3/uL (ref 0.00–0.07)
BASOS ABS: 0.1 10*3/uL (ref 0.0–0.1)
BASOS PCT: 1 %
EOS ABS: 0.2 10*3/uL (ref 0.0–0.5)
EOS PCT: 2 %
HCT: 42.6 % (ref 39.0–52.0)
Hemoglobin: 13.8 g/dL (ref 13.0–17.0)
IMMATURE GRANULOCYTES: 0 %
Lymphocytes Relative: 41 %
Lymphs Abs: 3.4 10*3/uL (ref 0.7–4.0)
MCH: 29.2 pg (ref 26.0–34.0)
MCHC: 32.4 g/dL (ref 30.0–36.0)
MCV: 90.3 fL (ref 80.0–100.0)
Monocytes Absolute: 0.7 10*3/uL (ref 0.1–1.0)
Monocytes Relative: 8 %
NEUTROS PCT: 48 %
Neutro Abs: 4 10*3/uL (ref 1.7–7.7)
PLATELETS: 255 10*3/uL (ref 150–400)
RBC: 4.72 MIL/uL (ref 4.22–5.81)
RDW: 13.2 % (ref 11.5–15.5)
WBC: 8.4 10*3/uL (ref 4.0–10.5)
nRBC: 0 % (ref 0.0–0.2)

## 2018-06-21 LAB — COMPREHENSIVE METABOLIC PANEL
ALBUMIN: 4.4 g/dL (ref 3.5–5.0)
ALT: 22 U/L (ref 0–44)
ANION GAP: 8 (ref 5–15)
AST: 20 U/L (ref 15–41)
Alkaline Phosphatase: 59 U/L (ref 38–126)
BUN: 18 mg/dL (ref 6–20)
CHLORIDE: 109 mmol/L (ref 98–111)
CO2: 23 mmol/L (ref 22–32)
Calcium: 8.9 mg/dL (ref 8.9–10.3)
Creatinine, Ser: 1.05 mg/dL (ref 0.61–1.24)
GFR calc Af Amer: >60 mL/min (ref >60)
GLUCOSE: 156 mg/dL — AB (ref 70–99)
POTASSIUM: 3.9 mmol/L (ref 3.5–5.1)
Sodium: 140 mmol/L (ref 135–145)
TOTAL PROTEIN: 7.1 g/dL (ref 6.5–8.1)
Total Bilirubin: 0.6 mg/dL (ref 0.3–1.2)

## 2018-06-21 LAB — URINALYSIS, COMPLETE (UACMP) WITH MICROSCOPIC
BILIRUBIN URINE: NEGATIVE
Bacteria, UA: NONE SEEN
GLUCOSE, UA: 150 mg/dL — AB
KETONES UR: NEGATIVE mg/dL
LEUKOCYTE UA: NEGATIVE
Nitrite: NEGATIVE
PH: 5 (ref 5.0–8.0)
PROTEIN: NEGATIVE mg/dL
Specific Gravity, Urine: 1.019 (ref 1.005–1.030)

## 2018-06-21 LAB — LIPASE, BLOOD: Lipase: 29 U/L (ref 11–51)

## 2018-06-21 NOTE — ED Triage Notes (Signed)
Patient ambulatory to triage with steady gait, without difficulty or distress noted; pt reports right flank/side pain tonight with no accomp symptoms; st hx of same with pancreatitis

## 2018-06-22 ENCOUNTER — Encounter: Payer: Self-pay | Admitting: Radiology

## 2018-06-22 ENCOUNTER — Emergency Department
Admission: EM | Admit: 2018-06-22 | Discharge: 2018-06-22 | Disposition: A | Discharge status: Home / Self Care | Payer: Managed Care, Other (non HMO) | Attending: Emergency Medicine | Admitting: Emergency Medicine

## 2018-06-22 ENCOUNTER — Emergency Department: Payer: Managed Care, Other (non HMO)

## 2018-06-22 DIAGNOSIS — R109 Unspecified abdominal pain: Secondary | ICD-10-CM

## 2018-06-22 DIAGNOSIS — R319 Hematuria, unspecified: Secondary | ICD-10-CM

## 2018-06-22 MED ORDER — IOPAMIDOL (ISOVUE-300) INJECTION 61%
125.0000 mL | Freq: Once | INTRAVENOUS | Status: AC | PRN
  Administered 2018-06-22: 125 mL via INTRAVENOUS

## 2018-06-22 NOTE — ED Notes (Signed)
Reference triage note. Pt denies any burning with urination or other urinary symptoms at this time. Pt respirations even and unlabored at this time. This RN will continue to monitor.

## 2018-06-22 NOTE — ED Provider Notes (Signed)
[facility] Emergency Department Provider Note  ____________________________________________   First MD Initiated Contact with Patient 06/22/18 [phone]     (approximate)  I have reviewed the triage vital signs and the nursing notes.   HISTORY  Chief Complaint Flank Pain    HPI Ryan Dickerson is a 58 y.o. male with prior medical history as listed below who presents for evaluation of gradually worsening right flank pain radiating into his abdomen.  This is been going on for  about 6 hours.  It feels dull and aching but recently has felt little bit more sharp in his right lower abdomen.  Nothing in particular makes his symptoms better or worse.  He denies fever/chills, chest pain, shortness of breath, nausea, vomiting, dysuria, and hematuria.  He was concerned because he had pancreatitis 20+ years ago and the symptoms felt similar and he had to spend 9 days in the hospital.  That was when he was diagnosed with diabetes.  He does not drink or smoke although he did both in the past.  He had a cholecystectomy about 6 years ago.  He is compliant with his diabetes medications.  He has not had any recent trauma.  He states that he was on the treadmill for a couple of miles today and may have pulled something but he is not sure.  He describes the pain as moderate in intensity.     Past Medical History:  Diagnosis Date  . Diabetes mellitus without complication (HCC)   . Pancreatitis 2007  . Sternal fracture     Patient Active Problem List   Diagnosis Date Noted  . Calculus of gallbladder with other cholecystitis, without mention of obstruction 10/20/2012  . Cholecystitis without calculus 10/06/2012    Past Surgical History:  Procedure Laterality Date  . BRAIN SURGERY  1972   tumor  . CHOLECYSTECTOMY  10-11-12  . COLONOSCOPY  12-15-08   Dr Lemar Livings    Prior to Admission medications   Medication Sig Start Date End Date Taking? Authorizing Provider  acetaminophen  (TYLENOL) 325 MG tablet Take 650 mg by mouth every 6 (six) hours as needed. 10/10/14   [provider]  aspirin 81 MG tablet Take 81 mg by mouth daily.    [provider]  clonazePAM (KLONOPIN) 1 MG tablet Take 1 mg by mouth daily.    [provider]  ibuprofen (ADVIL,MOTRIN) 800 MG tablet Take 800 mg by mouth every 8 (eight) hours as needed. 10/23/14   [provider]  Insulin Glargine (TOUJEO SOLOSTAR) 300 UNIT/ML SOPN Inject 45 Units into the skin at bedtime. 01/18/15   [provider]  lisinopril (PRINIVIL,ZESTRIL) 5 MG tablet Take 5 mg by mouth daily. 11/01/13   [provider]  metFORMIN (GLUCOPHAGE) 500 MG tablet Take 1,000 mg by mouth 2 (two) times daily.    [provider]  naproxen (NAPROSYN) 500 MG tablet Take 1 tablet (500 mg total) by mouth 2 (two) times daily with a meal. 09/25/15   Hagler, Jami L, PA-C  Omega 3-6-9 CAPS Take 1,200 mg by mouth 2 (two) times daily.    [provider]  pioglitazone (ACTOS) 30 MG tablet Take 30 mg by mouth daily. 01/18/15 01/18/16  [provider]    Allergies Patient has no known allergies.  Family History  Problem Relation Age of Onset  . Diabetes Father   . Stroke Father     Social History Social History   Tobacco Use  . Smoking  status: Former Smoker    Packs/day: 0.50    Years: 30.00    Pack years: 15.00    Types: Cigarettes    Last attempt to quit: 04/28/2013    Years since quitting: 5.1  . Smokeless tobacco: Never Used  Substance Use Topics  . Alcohol use: No    Alcohol/week: 0.0 standard drinks  . Drug use: No    Review of Systems Constitutional: No fever/chills Eyes: No visual changes. ENT: No sore throat. Cardiovascular: Denies chest pain. Respiratory: Denies shortness of breath. Gastrointestinal: Right flank pain radiating to the right abdomen as described above.  No nausea, vomiting, nor diarrhea. Genitourinary: Negative for  dysuria. Musculoskeletal: Negative for neck pain.  Negative for back pain. Integumentary: Negative for rash. Neurological: Negative for headaches, focal weakness or numbness.   ____________________________________________   PHYSICAL EXAM:  VITAL SIGNS: ED Triage Vitals  Enc Vitals Group     BP 06/21/18 2139 129/74     Pulse Rate 06/21/18 2139 95     Resp 06/21/18 2139 18     Temp 06/21/18 2139 97.8 F (36.6 C)     Temp Source 06/21/18 2139 Oral     SpO2 06/21/18 2139 95 %     Weight 06/21/18 2138 106.6 kg (235 lb)     Height 06/21/18 2138 1.753 m (5\' 9" )     Head Circumference --      Peak Flow --      Pain Score 06/21/18 2137 5     Pain Loc --      Pain Edu? --      Excl. in GC? --     Constitutional: Alert and oriented. Well appearing and in no acute distress. Eyes: Conjunctivae are normal.  Head: Atraumatic. Nose: No congestion/rhinnorhea. Mouth/Throat: Mucous membranes are moist. Neck: No stridor.  No meningeal signs.   Cardiovascular: Normal rate, regular rhythm. Good peripheral circulation. Grossly normal heart sounds. Respiratory: Normal respiratory effort.  No retractions. Lungs CTAB. Gastrointestinal: Soft and nontender. No distention.  Musculoskeletal: No CVA tenderness to percussion.  No lower extremity tenderness nor edema. No gross deformities of extremities. Neurologic:  Normal speech and language. No gross focal neurologic deficits are appreciated.  Skin:  Skin is warm, dry and intact. No rash noted. Psychiatric: Mood and affect are normal. Speech and behavior are normal.  ____________________________________________   LABS (all labs ordered are listed, but only abnormal results are displayed)  Labs Reviewed  COMPREHENSIVE METABOLIC PANEL - Abnormal; Notable for the following components:      Result Value   Glucose, Bld 156 (*)    All other components within normal limits  URINALYSIS, COMPLETE (UACMP) WITH MICROSCOPIC - Abnormal; Notable for the  following components:   Color, Urine YELLOW (*)    APPearance CLEAR (*)    Glucose, UA 150 (*)    Hgb urine dipstick MODERATE (*)    All other components within normal limits  CBC WITH DIFFERENTIAL/PLATELET  LIPASE, BLOOD   ____________________________________________  EKG  No indication for EKG ____________________________________________  RADIOLOGY   ED MD interpretation: No acute abnormalities identified on CT scan.  Official radiology report(s): Ct Abdomen Pelvis W Contrast  Result Date: 06/22/2018 CLINICAL DATA:  58 year old male with flank pain. Concern for stone disease. EXAM: CT ABDOMEN AND PELVIS WITH CONTRAST TECHNIQUE: Multidetector CT imaging of the abdomen and pelvis was performed using the standard protocol following bolus administration of intravenous contrast. CONTRAST:  ISOVUE-300 IOPAMIDOL (ISOVUE-300) INJECTION 61% COMPARISON:  None. FINDINGS: Lower  chest: The visualized lung bases are clear. There is coronary vascular calcification. No intra-abdominal free air or free fluid. Hepatobiliary: Probable fatty infiltration of the liver. No intrahepatic biliary ductal dilatation. Cholecystectomy. Pancreas: Unremarkable. No pancreatic ductal dilatation or surrounding inflammatory changes. Spleen: Normal in size without focal abnormality. Adrenals/Urinary Tract: The adrenal glands are unremarkable. Subcentimeter left renal hypodense lesion is too small to characterize. There is no hydronephrosis on either side. There is symmetric enhancement and excretion of contrast by both kidneys. The visualized ureters appear unremarkable. Small left posterior bladder Hutch diverticulum. Stomach/Bowel: Sigmoid diverticulosis without active inflammatory changes. There is no bowel obstruction or active inflammation. Normal appendix. Vascular/Lymphatic: Mild aortoiliac atherosclerotic disease. No portal venous gas. There is no adenopathy. Reproductive: The prostate and seminal vesicles are  grossly unremarkable. No pelvic mass. Other: None Musculoskeletal: Lower lumbar facet arthropathy. No acute osseous pathology. IMPRESSION: 1. No acute intra-abdominal or pelvic pathology. No hydronephrosis. 2. Sigmoid diverticulosis. No bowel obstruction or active inflammation. Normal appendix. Electronically Signed   By: Elgie Collard M.D.   On: 06/22/2018 02:04    ____________________________________________   PROCEDURES   Procedure(s) performed (including Critical Care):  Procedures   ____________________________________________   INITIAL IMPRESSION / MDM / ASSESSMENT AND PLAN / ED COURSE  As part of my medical decision making, I reviewed the following data within the electronic MEDICAL RECORD NUMBER Nursing notes reviewed and incorporated, Labs reviewed , Old chart reviewed and Notes from prior ED visits       Differential diagnosis includes, but is not limited to, renal colic/ureteral stone, UTI, pancreatitis, biliary disease, appendicitis, musculoskeletal pain.  Less likely would be bladder cancer or other urological disease.  The patient is well-appearing and in no distress with no tenderness to palpation of his abdomen and no CVA tenderness.  His lab work is reassuring with a normal comprehensive metabolic panel including lipase, normal CBC, and normal urinalysis except for moderate hemoglobin.  His CT scan is also within normal limits with no evidence of any acute abnormalities.  I explained to him about the blood in his urine and I think he may have had a small stone which he has passed which would explain the changing nature of the symptoms and how he has no physical exam findings.  It would also explain is hemoglobinuria.  I encouraged him to take over-the-counter medication such as ibuprofen and Tylenol and follow-up with his regular doctor.  I also provided follow-up information with Dr. Apolinar Junes with [facility] if he wants to further evaluate the hemoglobin  in his urine.  I looked back in his record I see no prior urinalyses that I can compare to see if he has chronic hematuria or hemoglobinuria.  He is comfortable with the plan and I gave my usual customary return precautions.     ____________________________________________  FINAL CLINICAL IMPRESSION(S) / ED DIAGNOSES  Final diagnoses:  Flank pain  Abdominal pain, unspecified abdominal location  Hematuria, unspecified type     MEDICATIONS GIVEN DURING THIS VISIT:  Medications  iopamidol (ISOVUE-300) 61 % injection 125 mL (125 mLs Intravenous Contrast Given 06/22/18 0152)     ED Discharge Orders    None       Note:  This document was prepared using Dragon voice recognition software and may include unintentional dictation errors.   Loleta Rose, MD 06/22/18 [phone]

## 2018-06-22 NOTE — Discharge Instructions (Signed)
As we discussed, your work-up was reassuring today.  The only thing abnormal was some hemoglobin in your urine.  Based on these symptoms, I think it is possible you had a small kidney stone which you passed in which resulted in some hemoglobinuria.  However, we always recommend that you follow-up with your regular doctor to determine if additional testing is needed to determine the cause of the blood in your urine, particularly if it persists after a week or so when you are no longer having symptoms.  You can follow-up with your primary care provider or contact Dr. Apolinar Junes with [facility] to determine if additional testing is recommended.      Return to the emergency department if you develop new or worsening symptoms that concern you.

## 2018-11-08 ENCOUNTER — Telehealth: Payer: Self-pay | Admitting: Internal Medicine

## 2018-11-08 ENCOUNTER — Other Ambulatory Visit: Payer: Managed Care, Other (non HMO)

## 2018-11-08 DIAGNOSIS — Z20822 Contact with and (suspected) exposure to covid-19: Secondary | ICD-10-CM

## 2018-11-08 NOTE — Telephone Encounter (Signed)
Pt scheduled for testing at [facility] site today Requested by Dr. Benita Stabile at [facility]  Pt with cough, fever, body aches.  Testing process reviewed, stay in car, wear mask; pt verbalizes understanding.  Pts CB# [place] # [phone] FAX [phone]

## 2018-11-09 NOTE — Telephone Encounter (Signed)
error 

## 2018-11-11 ENCOUNTER — Emergency Department
Admission: EM | Admit: 2018-11-11 | Discharge: 2018-11-11 | Disposition: A | Discharge status: Home / Self Care | Payer: Managed Care, Other (non HMO) | Attending: Emergency Medicine | Admitting: Emergency Medicine

## 2018-11-11 ENCOUNTER — Other Ambulatory Visit: Payer: Self-pay

## 2018-11-11 ENCOUNTER — Encounter: Payer: Self-pay | Admitting: Emergency Medicine

## 2018-11-11 ENCOUNTER — Emergency Department: Payer: Managed Care, Other (non HMO)

## 2018-11-11 DIAGNOSIS — Z87891 Personal history of nicotine dependence: Secondary | ICD-10-CM | POA: Insufficient documentation

## 2018-11-11 DIAGNOSIS — E119 Type 2 diabetes mellitus without complications: Secondary | ICD-10-CM | POA: Insufficient documentation

## 2018-11-11 DIAGNOSIS — R109 Unspecified abdominal pain: Secondary | ICD-10-CM | POA: Diagnosis present

## 2018-11-11 DIAGNOSIS — R1031 Right lower quadrant pain: Secondary | ICD-10-CM | POA: Insufficient documentation

## 2018-11-11 LAB — BASIC METABOLIC PANEL
Anion gap: 10 (ref 5–15)
BUN: 15 mg/dL (ref 6–20)
CO2: 25 mmol/L (ref 22–32)
Calcium: 9.2 mg/dL (ref 8.9–10.3)
Chloride: 105 mmol/L (ref 98–111)
Creatinine, Ser: 1.01 mg/dL (ref 0.61–1.24)
GFR calc Af Amer: >60 mL/min (ref >60)
GFR calc non Af Amer: >60 mL/min (ref >60)
Glucose, Bld: 135 mg/dL — ABNORMAL HIGH (ref 70–99)
Potassium: 4 mmol/L (ref 3.5–5.1)
Sodium: 140 mmol/L (ref 135–145)

## 2018-11-11 LAB — CBC
HCT: 43.1 % (ref 39.0–52.0)
Hemoglobin: 14.2 g/dL (ref 13.0–17.0)
MCH: 29.5 pg (ref 26.0–34.0)
MCHC: 32.9 g/dL (ref 30.0–36.0)
MCV: 89.6 fL (ref 80.0–100.0)
Platelets: 227 10*3/uL (ref 150–400)
RBC: 4.81 MIL/uL (ref 4.22–5.81)
RDW: 13.3 % (ref 11.5–15.5)
WBC: 6.7 10*3/uL (ref 4.0–10.5)
nRBC: 0 % (ref 0.0–0.2)

## 2018-11-11 LAB — URINALYSIS, COMPLETE (UACMP) WITH MICROSCOPIC
Bacteria, UA: NONE SEEN
Bilirubin Urine: NEGATIVE
Glucose, UA: 50 mg/dL — AB
Ketones, ur: NEGATIVE mg/dL
Leukocytes,Ua: NEGATIVE
Nitrite: NEGATIVE
Protein, ur: NEGATIVE mg/dL
Specific Gravity, Urine: 1.015 (ref 1.005–1.030)
Squamous Epithelial / LPF: NONE SEEN (ref 0–5)
pH: 5 (ref 5.0–8.0)

## 2018-11-11 MED ORDER — OXYCODONE-ACETAMINOPHEN 5-325 MG PO TABS: 1.0000 | ORAL_TABLET | ORAL | 0 refills | Status: DC | PRN

## 2018-11-11 MED ORDER — KETOROLAC TROMETHAMINE 60 MG/2ML IM SOLN
60.0000 mg | Freq: Once | INTRAMUSCULAR | Status: AC
  Administered 2018-11-11: 60 mg via INTRAMUSCULAR
  Filled 2018-11-11: qty 2

## 2018-11-11 MED ORDER — TRAMADOL HCL 50 MG PO TABS: 50.0000 mg | ORAL_TABLET | Freq: Four times a day (QID) | ORAL | 0 refills | Status: DC | PRN

## 2018-11-11 NOTE — ED Notes (Signed)
ED Provider at bedside. 

## 2018-11-11 NOTE — ED Triage Notes (Signed)
Pt c/o RT sided flank pain x3days. C/o frequent urination, denies dysuria. VSS . NAD noted

## 2018-11-11 NOTE — ED Notes (Signed)
Sent green and purple top tubes to lab. 

## 2018-11-11 NOTE — ED Provider Notes (Signed)
[facility] Emergency Department Provider Note  Time seen: 8:22 PM  I have reviewed the triage vital signs and the nursing notes.   HISTORY  Chief Complaint Flank Pain   HPI Ryan Dickerson is a 58 y.o. male with a past medical history of diabetes, presents to the emergency department for right flank pain.  According to the patient for the past 1 week or so he has been experiencing intermittent right flank pain.  Patient states at first he had a fever with a cough, was tested for coronavirus but never got his results back.  Patient states over the past few days the cough and fever have completely resolved but he is continuing to have pain in his right flank/right lower quadrant.  Denies any pain with movement.  Denies any vomiting or diarrhea, black or bloody stool, hematuria or dysuria.  Patient states the pain is become more constant which is what brought him to the emergency department tonight.   Past Medical History:  Diagnosis Date  . Diabetes mellitus without complication (HCC)   . Pancreatitis 2007  . Sternal fracture     Patient Active Problem List   Diagnosis Date Noted  . Calculus of gallbladder with other cholecystitis, without mention of obstruction 10/20/2012  . Cholecystitis without calculus 10/06/2012    Past Surgical History:  Procedure Laterality Date  . BRAIN SURGERY  1972   tumor  . CHOLECYSTECTOMY  10-11-12  . COLONOSCOPY  12-15-08   Dr Lemar LivingsByrnett    Prior to Admission medications   Medication Sig Start Date End Date Taking? Authorizing Provider  acetaminophen (TYLENOL) 325 MG tablet Take 650 mg by mouth every 6 (six) hours as needed. 10/10/14   [provider]  aspirin 81 MG tablet Take 81 mg by mouth daily.    [provider]  clonazePAM (KLONOPIN) 1 MG tablet Take 1 mg by mouth daily.    [provider]  ibuprofen (ADVIL,MOTRIN) 800 MG tablet Take 800 mg by mouth every 8 (eight) hours as needed. 10/23/14    [provider]  Insulin Glargine (TOUJEO SOLOSTAR) 300 UNIT/ML SOPN Inject 45 Units into the skin at bedtime. 01/18/15   [provider]  lisinopril (PRINIVIL,ZESTRIL) 5 MG tablet Take 5 mg by mouth daily. 11/01/13   [provider]  metFORMIN (GLUCOPHAGE) 500 MG tablet Take 1,000 mg by mouth 2 (two) times daily.    [provider]  naproxen (NAPROSYN) 500 MG tablet Take 1 tablet (500 mg total) by mouth 2 (two) times daily with a meal. 09/25/15   Hagler, Jami L, PA-C  Omega 3-6-9 CAPS Take 1,200 mg by mouth 2 (two) times daily.    [provider]  pioglitazone (ACTOS) 30 MG tablet Take 30 mg by mouth daily. 01/18/15 01/18/16  [provider]    No Known Allergies  Family History  Problem Relation Age of Onset  . Diabetes Father   . Stroke Father     Social History Social History   Tobacco Use  . Smoking status: Former Smoker    Packs/day: 0.50    Years: 30.00    Pack years: 15.00    Types: Cigarettes    Quit date: 04/28/2013    Years since quitting: 5.5  . Smokeless tobacco: Never Used  Substance Use Topics  . Alcohol use: No    Alcohol/week: 0.0 standard drinks  . Drug use: No    Review of Systems Constitutional: Negative for fever. Cardiovascular: Negative for chest  pain. Respiratory: Negative for shortness of breath. Gastrointestinal: Right flank/right lower quadrant pain.  Moderate in severity aching type pain.  Negative for vomiting or diarrhea Genitourinary: Negative for hematuria or dysuria Musculoskeletal: Negative for musculoskeletal complaints Skin: Negative for skin complaints  Neurological: Negative for headache All other ROS negative  ____________________________________________   PHYSICAL EXAM:  VITAL SIGNS: ED Triage Vitals  Enc Vitals Group     BP 11/11/18 1732 (!) 151/72     Pulse Rate 11/11/18 1732 85     Resp 11/11/18 1732 20     Temp 11/11/18 1732 98.8 F (37.1 C)     Temp Source 11/11/18  1732 Oral     SpO2 11/11/18 1732 96 %     Weight 11/11/18 1732 219 lb (99.3 kg)     Height 11/11/18 1732 5\' 9"  (1.753 m)     Head Circumference --      Peak Flow --      Pain Score 11/11/18 1736 9     Pain Loc --      Pain Edu? --      Excl. in [place]? --     Constitutional: Alert and oriented. Well appearing and in no distress. Eyes: Normal exam ENT      Head: Normocephalic and atraumatic.      Mouth/Throat: Mucous membranes are moist. Cardiovascular: Normal rate, regular rhythm. Respiratory: Normal respiratory effort without tachypnea nor retractions. Breath sounds are clear  Gastrointestinal: Soft and nontender. No distention.  Musculoskeletal: Nontender with normal range of motion in all extremities. Neurologic:  Normal speech and language. No gross focal neurologic deficits  Skin:  Skin is warm, dry and intact.  Psychiatric: Mood and affect are normal.  ____________________________________________    RADIOLOGY  CT shows no acute abnormality.  ____________________________________________   INITIAL IMPRESSION / ASSESSMENT AND PLAN / ED COURSE  Pertinent labs & imaging results that were available during my care of the patient were reviewed by me and considered in my medical decision making (see chart for details).   Patient presents emergency department for right flank pain ongoing for approximately 5 days now.  Differential is quite broad would include ureterolithiasis, pyelonephritis, UTI, colitis or diverticulitis, musculoskeletal pain, inguinal hernia.  Patient's lab work is largely nonrevealing, urinalysis is negative for blood or infection.  We will proceed with CT renal scan to further evaluate.  Patient agreeable to plan of care.  CT is negative for acute abnormality.  We will discharge with a short course of pain medication and have the patient follow-up with his doctor.  Patient agreeable to plan of care.  Ryan Dickerson was evaluated in Emergency Department on  11/11/2018 for the symptoms described in the history of present illness. He was evaluated in the context of the global COVID-19 pandemic, which necessitated consideration that the patient might be at risk for infection with the SARS-CoV-2 virus that causes COVID-19. Institutional protocols and algorithms that pertain to the evaluation of patients at risk for COVID-19 are in a state of rapid change based on information released by regulatory bodies including the CDC and federal and state organizations. These policies and algorithms were followed during the patient's care in the ED.  ____________________________________________   FINAL CLINICAL IMPRESSION(S) / ED DIAGNOSES  Right flank pain   Harvest Dark, MD 11/11/18 2054

## 2018-11-12 LAB — NOVEL CORONAVIRUS, NAA: SARS-CoV-2, NAA: NOT DETECTED

## 2019-03-30 ENCOUNTER — Other Ambulatory Visit: Payer: Self-pay | Admitting: General Surgery

## 2019-04-01 ENCOUNTER — Other Ambulatory Visit
Admission: RE | Admit: 2019-04-01 | Discharge: 2019-04-01 | Disposition: A | Discharge status: Home / Self Care | Payer: Managed Care, Other (non HMO) | Source: Ambulatory Visit | Attending: General Surgery | Admitting: General Surgery

## 2019-04-01 DIAGNOSIS — Z20828 Contact with and (suspected) exposure to other viral communicable diseases: Secondary | ICD-10-CM | POA: Diagnosis not present

## 2019-04-01 DIAGNOSIS — Z01812 Encounter for preprocedural laboratory examination: Secondary | ICD-10-CM | POA: Insufficient documentation

## 2019-04-01 LAB — SARS CORONAVIRUS 2 (TAT 6-24 HRS): SARS Coronavirus 2: NEGATIVE

## 2019-04-06 ENCOUNTER — Ambulatory Visit
Admission: RE | Admit: 2019-04-06 | Discharge: 2019-04-06 | Disposition: A | Discharge status: Home / Self Care | Payer: Managed Care, Other (non HMO) | Attending: General Surgery | Admitting: General Surgery

## 2019-04-06 ENCOUNTER — Encounter: Payer: Self-pay | Admitting: *Deleted

## 2019-04-06 ENCOUNTER — Other Ambulatory Visit: Payer: Self-pay

## 2019-04-06 ENCOUNTER — Encounter
Admission: RE | Disposition: A | Discharge status: Home / Self Care | Payer: Self-pay | Source: Home / Self Care | Attending: General Surgery

## 2019-04-06 ENCOUNTER — Ambulatory Visit: Payer: Managed Care, Other (non HMO) | Admitting: Anesthesiology

## 2019-04-06 DIAGNOSIS — Z791 Long term (current) use of non-steroidal anti-inflammatories (NSAID): Secondary | ICD-10-CM | POA: Diagnosis not present

## 2019-04-06 DIAGNOSIS — Z79899 Other long term (current) drug therapy: Secondary | ICD-10-CM | POA: Diagnosis not present

## 2019-04-06 DIAGNOSIS — Z7982 Long term (current) use of aspirin: Secondary | ICD-10-CM | POA: Diagnosis not present

## 2019-04-06 DIAGNOSIS — K5730 Diverticulosis of large intestine without perforation or abscess without bleeding: Secondary | ICD-10-CM | POA: Diagnosis not present

## 2019-04-06 DIAGNOSIS — Z87891 Personal history of nicotine dependence: Secondary | ICD-10-CM | POA: Diagnosis not present

## 2019-04-06 DIAGNOSIS — E119 Type 2 diabetes mellitus without complications: Secondary | ICD-10-CM | POA: Insufficient documentation

## 2019-04-06 DIAGNOSIS — Z9049 Acquired absence of other specified parts of digestive tract: Secondary | ICD-10-CM | POA: Insufficient documentation

## 2019-04-06 DIAGNOSIS — Z1211 Encounter for screening for malignant neoplasm of colon: Secondary | ICD-10-CM | POA: Diagnosis present

## 2019-04-06 DIAGNOSIS — Z794 Long term (current) use of insulin: Secondary | ICD-10-CM | POA: Diagnosis not present

## 2019-04-06 HISTORY — PX: COLONOSCOPY WITH PROPOFOL: SHX5780

## 2019-04-06 LAB — GLUCOSE, CAPILLARY: Glucose-Capillary: 123 mg/dL — ABNORMAL HIGH (ref 70–99)

## 2019-04-06 SURGERY — COLONOSCOPY WITH PROPOFOL
Anesthesia: General

## 2019-04-06 MED ORDER — PROPOFOL 500 MG/50ML IV EMUL
INTRAVENOUS | Status: DC | PRN
  Administered 2019-04-06: 200 ug/kg/min via INTRAVENOUS

## 2019-04-06 MED ORDER — PROPOFOL 500 MG/50ML IV EMUL
INTRAVENOUS | Status: AC
  Filled 2019-04-06: qty 50

## 2019-04-06 MED ORDER — SODIUM CHLORIDE 0.9 % IV SOLN
INTRAVENOUS | Status: DC
  Administered 2019-04-06: 08:00:00 via INTRAVENOUS

## 2019-04-06 MED ORDER — PROPOFOL 10 MG/ML IV BOLUS
INTRAVENOUS | Status: DC | PRN
  Administered 2019-04-06: 70 mg via INTRAVENOUS
  Administered 2019-04-06: 30 mg via INTRAVENOUS

## 2019-04-06 MED ORDER — LIDOCAINE HCL (PF) 2 % IJ SOLN
INTRAMUSCULAR | Status: AC
  Filled 2019-04-06: qty 10

## 2019-04-06 MED ORDER — LIDOCAINE 2% (20 MG/ML) 5 ML SYRINGE
INTRAMUSCULAR | Status: DC | PRN
  Administered 2019-04-06: 50 mg via INTRAVENOUS

## 2019-04-06 NOTE — Op Note (Signed)
[facility] Gastroenterology Patient Name: Ryan ForestJeffrey Dickerson Procedure Date: 04/06/2019 7:23 AM MRN: [id number] Account #: [id number] Date of Birth: 07/02/1960 Admit Type: Outpatient Age: 58 Room: [facility] ENDO ROOM 1 Gender: Male Note Status: Finalized Procedure:             Colonoscopy Indications:           Screening for colorectal malignant neoplasm Providers:             Earline MayotteJeffrey W. Sonita Michiels, MD Referring MD:          Jillene Bucksenny C. Arlana Pouchate, MD (Referring MD) Medicines:             Monitored Anesthesia Care Complications:         No immediate complications. Procedure:             Pre-Anesthesia Assessment:                        - Prior to the procedure, a History and Physical was                         performed, and patient medications, allergies and                         sensitivities were reviewed. The patient's tolerance                         of previous anesthesia was reviewed.                        - The risks and benefits of the procedure and the                         sedation options and risks were discussed with the                         patient. All questions were answered and informed                         consent was obtained.                        After obtaining informed consent, the colonoscope was                         passed under direct vision. Throughout the procedure,                         the patient's blood pressure, pulse, and oxygen                         saturations were monitored continuously. The was                         introduced through the anus and advanced to the the                         cecum, identified by appendiceal orifice and ileocecal  valve. The colonoscopy was somewhat difficult due to a                         tortuous colon. Successful completion of the procedure                         was aided by using manual pressure. The patient                         tolerated the procedure well.  The quality of the bowel                         preparation was adequate to identify polyps.                        Significant stool volume irrigated successfully. A two                         day prep would be recommended for the next exam. Findings:      A few medium-mouthed diverticula were found in the sigmoid colon and       ascending colon.      The retroflexed view of the distal rectum and anal verge was normal and       showed no anal or rectal abnormalities. Impression:            - Diverticulosis in the sigmoid colon and in the                         ascending colon.                        - The distal rectum and anal verge are normal on                         retroflexion view.                        - No specimens collected. Recommendation:        - Repeat colonoscopy in 10 years for screening                         purposes. Procedure Code(s):     --- Professional ---                        [phone], Colonoscopy, flexible; diagnostic, including                         collection of specimen(s) by brushing or washing, when                         performed (separate procedure) Diagnosis Code(s):     --- Professional ---                        Z12.11, Encounter for screening for malignant neoplasm                         of colon CPT copyright 2019 American Medical Association. All rights reserved. The codes  documented in this report are preliminary and upon coder review may  be revised to meet current compliance requirements. Earline Mayotte, MD 04/06/2019 8:29:24 AM This report has been signed electronically. Number of Addenda: 0 Note Initiated On: 04/06/2019 7:23 AM Scope Withdrawal Time: 0 hours 25 minutes 19 seconds  Total Procedure Duration: 0 hours 37 minutes 50 seconds  Estimated Blood Loss:  Estimated blood loss: none.      [facility]

## 2019-04-06 NOTE — H&P (Signed)
Ryan Dickerson [id number] 04/30/1960     HPI:  Healthy 58 y/o male for screening colonoscopy.  Tolerated prep well. Stool this AM fairly clear.  Had an enema this AM for clearing.   Medications Prior to Admission  Medication Sig Dispense Refill Last Dose  . acetaminophen (TYLENOL) 325 MG tablet Take 650 mg by mouth every 6 (six) hours as needed.   Past Week at Unknown time  . aspirin 81 MG tablet Take 81 mg by mouth daily.   04/05/2019 at 0800  . clonazePAM (KLONOPIN) 1 MG tablet Take 1 mg by mouth daily.   Past Week at Unknown time  . ibuprofen (ADVIL,MOTRIN) 800 MG tablet Take 800 mg by mouth every 8 (eight) hours as needed.   Past Week at Unknown time  . Insulin Glargine (TOUJEO SOLOSTAR) 300 UNIT/ML SOPN Inject 45 Units into the skin at bedtime.   04/05/2019 at 1600  . lisinopril (PRINIVIL,ZESTRIL) 5 MG tablet Take 5 mg by mouth daily.   04/05/2019 at 0800  . metFORMIN (GLUCOPHAGE) 500 MG tablet Take 1,000 mg by mouth 2 (two) times daily.   04/05/2019 at 1800  . naproxen (NAPROSYN) 500 MG tablet Take 1 tablet (500 mg total) by mouth 2 (two) times daily with a meal. 14 tablet 0 04/05/2019 at 1800  . Omega 3-6-9 CAPS Take 1,200 mg by mouth 2 (two) times daily.   Past Week at Unknown time  . oxyCODONE-acetaminophen (PERCOCET) 5-325 MG tablet Take 1 tablet by mouth every 4 (four) hours as needed for severe pain. (Patient not taking: Reported on 04/06/2019) 20 tablet 0 Not Taking at Unknown time  . pioglitazone (ACTOS) 30 MG tablet Take 30 mg by mouth daily.     . traMADol (ULTRAM) 50 MG tablet Take 1 tablet (50 mg total) by mouth every 6 (six) hours as needed. (Patient not taking: Reported on 04/06/2019) 10 tablet 0 Not Taking at Unknown time   No Known Allergies Past Medical History:  Diagnosis Date  . Diabetes mellitus without complication ([place])   . Pancreatitis 2007  . Sternal fracture    Past Surgical History:  Procedure Laterality Date  . BRAIN SURGERY  1972   tumor  . CHOLECYSTECTOMY   10-11-12  . COLONOSCOPY  12-15-08   Dr Bary Castilla   Social History   Socioeconomic History  . Marital status: Married    Spouse name: Not on file  . Number of children: Not on file  . Years of education: Not on file  . Highest education level: Not on file  Occupational History  . Not on file  Social Needs  . Financial resource strain: Not on file  . Food insecurity    Worry: Not on file    Inability: Not on file  . Transportation needs    Medical: Not on file    Non-medical: Not on file  Tobacco Use  . Smoking status: Former Smoker    Packs/day: 0.50    Years: 30.00    Pack years: 15.00    Types: Cigarettes    Quit date: 04/28/2013    Years since quitting: 5.9  . Smokeless tobacco: Never Used  Substance and Sexual Activity  . Alcohol use: No    Alcohol/week: 0.0 standard drinks  . Drug use: No  . Sexual activity: Not on file  Lifestyle  . Physical activity    Days per week: Not on file    Minutes per session: Not on file  . Stress: Not on file  Relationships  . Social Musician on phone: Not on file    Gets together: Not on file    Attends religious service: Not on file    Active member of club or organization: Not on file    Attends meetings of clubs or organizations: Not on file    Relationship status: Not on file  . Intimate partner violence    Fear of current or ex partner: Not on file    Emotionally abused: Not on file    Physically abused: Not on file    Forced sexual activity: Not on file  Other Topics Concern  . Not on file  Social History Narrative  . Not on file   Social History   Social History Narrative  . Not on file     ROS: Negative.     PE: HEENT: Negative. Lungs: Clear. Cardio: RR.    Assessment/Plan:  Proceed with planned endoscopy. [name] 04/06/2019

## 2019-04-06 NOTE — Anesthesia Postprocedure Evaluation (Signed)
Anesthesia Post Note  Patient: Ryan Dickerson  Procedure(s) Performed: COLONOSCOPY WITH PROPOFOL (N/A )  Patient location during evaluation: Endoscopy Anesthesia Type: General Level of consciousness: awake and alert Pain management: pain level controlled Vital Signs Assessment: post-procedure vital signs reviewed and stable Respiratory status: spontaneous breathing and respiratory function stable Cardiovascular status: stable Anesthetic complications: no     Last Vitals:  Vitals:   04/06/19 0829 04/06/19 0839  BP: 109/71 119/85  Pulse: 78 82  Resp: 14 17  Temp: (!) 36.1 C   SpO2: 95% 96%    Last Pain:  Vitals:   04/06/19 0839  TempSrc:   PainSc: 0-No pain                 KEPHART,WILLIAM K

## 2019-04-06 NOTE — Anesthesia Preprocedure Evaluation (Signed)
Anesthesia Evaluation  Patient identified by MRN, date of birth, ID band Patient awake    Reviewed: Allergy & Precautions, NPO status , Patient's Chart, lab work & pertinent test results  History of Anesthesia Complications Negative for: history of anesthetic complications  Airway Mallampati: III       Dental  (+) Partial Upper   Pulmonary neg sleep apnea, neg COPD, Not current smoker, former smoker,           Cardiovascular (-) hypertension(-) Past MI and (-) CHF (-) dysrhythmias (-) Valvular Problems/Murmurs     Neuro/Psych neg Seizures    GI/Hepatic Neg liver ROS, neg GERD  ,  Endo/Other  diabetes, Type 2, Insulin Dependent, Oral Hypoglycemic Agents  Renal/GU negative Renal ROS     Musculoskeletal   Abdominal   Peds  Hematology   Anesthesia Other Findings   Reproductive/Obstetrics                             Anesthesia Physical Anesthesia Plan  ASA: III  Anesthesia Plan: General   Post-op Pain Management:    Induction: Intravenous  PONV Risk Score and Plan: 2 and Propofol infusion and TIVA  Airway Management Planned: Nasal Cannula  Additional Equipment:   Intra-op Plan:   Post-operative Plan:   Informed Consent: I have reviewed the patients History and Physical, chart, labs and discussed the procedure including the risks, benefits and alternatives for the proposed anesthesia with the patient or authorized representative who has indicated his/her understanding and acceptance.       Plan Discussed with:   Anesthesia Plan Comments:         Anesthesia Quick Evaluation

## 2019-04-06 NOTE — Transfer of Care (Signed)
Immediate Anesthesia Transfer of Care Note  Patient: Ryan Dickerson  Procedure(s) Performed: COLONOSCOPY WITH PROPOFOL (N/A )  Patient Location: Endoscopy Unit  Anesthesia Type:General  Level of Consciousness: awake  Airway & Oxygen Therapy: Patient connected to nasal cannula oxygen  Post-op Assessment: Post -op Vital signs reviewed and stable  Post vital signs: stable  Last Vitals:  Vitals Value Taken Time  BP 109/71 04/06/19 0829  Temp 36.1 C 04/06/19 0829  Pulse 80 04/06/19 0830  Resp 12 04/06/19 0830  SpO2 97 % 04/06/19 0830  Vitals shown include unvalidated device data.  Last Pain:  Vitals:   04/06/19 0829  TempSrc: Temporal  PainSc: 0-No pain         Complications: No apparent anesthesia complications

## 2019-04-06 NOTE — Anesthesia Post-op Follow-up Note (Signed)
Anesthesia QCDR form completed.        

## 2019-04-07 ENCOUNTER — Encounter: Payer: Self-pay | Admitting: *Deleted

## 2019-04-28 ENCOUNTER — Encounter: Payer: Self-pay | Admitting: Emergency Medicine

## 2019-04-28 ENCOUNTER — Other Ambulatory Visit: Payer: Self-pay

## 2019-04-28 ENCOUNTER — Ambulatory Visit
Admission: EM | Admit: 2019-04-28 | Discharge: 2019-04-28 | Disposition: A | Discharge status: Home / Self Care | Payer: Managed Care, Other (non HMO)

## 2019-04-28 DIAGNOSIS — R05 Cough: Secondary | ICD-10-CM | POA: Diagnosis not present

## 2019-04-28 DIAGNOSIS — M791 Myalgia, unspecified site: Secondary | ICD-10-CM

## 2019-04-28 DIAGNOSIS — J029 Acute pharyngitis, unspecified: Secondary | ICD-10-CM | POA: Diagnosis not present

## 2019-04-28 DIAGNOSIS — R059 Cough, unspecified: Secondary | ICD-10-CM

## 2019-04-28 DIAGNOSIS — R5383 Other fatigue: Secondary | ICD-10-CM | POA: Diagnosis not present

## 2019-04-28 DIAGNOSIS — Z87891 Personal history of nicotine dependence: Secondary | ICD-10-CM

## 2019-04-28 DIAGNOSIS — B349 Viral infection, unspecified: Secondary | ICD-10-CM

## 2019-04-28 LAB — POC SARS CORONAVIRUS 2 AG -  ED: SARS Coronavirus 2 Ag: NEGATIVE

## 2019-04-28 NOTE — ED Triage Notes (Signed)
Patient in office  Today c/o cough,sneezing,sorethroat. Last tested for covid was 04/06/19 which was neg.  PPJ:KDTO

## 2019-04-28 NOTE — Discharge Instructions (Signed)
Take over-the-counter Mucinex as needed for your congestion.    Your rapid COVID test is negative; the send-out test is pending.  You should self quarantine until your test result is back and is negative.    Go to the emergency department if you develop high fever, shortness of breath, severe diarrhea, or other concerning symptoms.

## 2019-04-28 NOTE — ED Provider Notes (Signed)
Ryan Dickerson    CSN: [id number] Arrival date & time: 04/28/19  1330      History   Chief Complaint Chief Complaint  Patient presents with  . Cough  . Sore Throat    HPI HASAAN Dickerson is a 58 y.o. male.   Patient presents with fatigue, body aches, nonproductive cough, sneezing, sore throat, chills, and fever x2 days.  Tmax 100.6  He denies rash, shortness of breath, congestion, vomiting, diarrhea, or other symptoms.  No treatments attempted at home.    The history is provided by the patient.    Past Medical History:  Diagnosis Date  . Diabetes mellitus without complication (HCC)   . Pancreatitis 2007  . Sternal fracture     Patient Active Problem List   Diagnosis Date Noted  . Calculus of gallbladder with other cholecystitis, without mention of obstruction 10/20/2012  . Cholecystitis without calculus 10/06/2012    Past Surgical History:  Procedure Laterality Date  . BRAIN SURGERY  1972   tumor  . CHOLECYSTECTOMY  10-11-12  . COLONOSCOPY  12-15-08   Dr Lemar Livings  . COLONOSCOPY WITH PROPOFOL N/A 04/06/2019   Procedure: COLONOSCOPY WITH PROPOFOL;  Surgeon: Earline Mayotte, MD;  Location: [facility];  Service: Endoscopy;  Laterality: N/A;       Home Medications    Prior to Admission medications   Medication Sig Start Date End Date Taking? Authorizing Provider  acetaminophen (TYLENOL) 325 MG tablet Take 650 mg by mouth every 6 (six) hours as needed. 10/10/14   [provider]  aspirin 81 MG tablet Take 81 mg by mouth daily.    [provider]  clonazePAM (KLONOPIN) 1 MG tablet Take 1 mg by mouth daily.    [provider]  ibuprofen (ADVIL,MOTRIN) 800 MG tablet Take 800 mg by mouth every 8 (eight) hours as needed. 10/23/14   [provider]  Insulin Glargine (TOUJEO SOLOSTAR) 300 UNIT/ML SOPN Inject 45 Units into the skin at bedtime. 01/18/15   [provider]  lisinopril (PRINIVIL,ZESTRIL) 5 MG tablet Take 5  mg by mouth daily. 11/01/13   [provider]  metFORMIN (GLUCOPHAGE) 500 MG tablet Take 1,000 mg by mouth 2 (two) times daily.    [provider]  naproxen (NAPROSYN) 500 MG tablet Take 1 tablet (500 mg total) by mouth 2 (two) times daily with a meal. 09/25/15   Hagler, Jami L, PA-C  Omega 3-6-9 CAPS Take 1,200 mg by mouth 2 (two) times daily.    [provider]  oxyCODONE-acetaminophen (PERCOCET) 5-325 MG tablet Take 1 tablet by mouth every 4 (four) hours as needed for severe pain. Patient not taking: Reported on 04/06/2019 11/11/18   Minna Antis, MD  pioglitazone (ACTOS) 30 MG tablet Take 30 mg by mouth daily. 01/18/15 01/18/16  [provider]  TOUJEO SOLOSTAR 300 UNIT/ML [facility]  03/09/19   [provider]  traMADol (ULTRAM) 50 MG tablet Take 1 tablet (50 mg total) by mouth every 6 (six) hours as needed. Patient not taking: Reported on 04/06/2019 11/11/18   Minna Antis, MD    Family History Family History  Problem Relation Age of Onset  . Diabetes Father   . Stroke Father     Social History Social History   Tobacco Use  . Smoking status: Former Smoker    Packs/day: 0.50    Years: 30.00    Pack years: 15.00    Types: Cigarettes    Quit date: 04/28/2013  Years since quitting: 6.0  . Smokeless tobacco: Never Used  Substance Use Topics  . Alcohol use: No    Alcohol/week: 0.0 standard drinks  . Drug use: No     Allergies   Patient has no known allergies.   Review of Systems Review of Systems  Constitutional: Positive for chills, fatigue and fever.  HENT: Positive for sneezing and sore throat. Negative for congestion, ear pain and rhinorrhea.   Eyes: Negative for pain and visual disturbance.  Respiratory: Positive for cough. Negative for shortness of breath.   Cardiovascular: Negative for chest pain and palpitations.  Gastrointestinal: Negative for abdominal pain, diarrhea and vomiting.  Genitourinary: Negative for  dysuria and hematuria.  Musculoskeletal: Negative for arthralgias and back pain.  Skin: Negative for color change and rash.  Neurological: Negative for seizures and syncope.  All other systems reviewed and are negative.    Physical Exam Triage Vital Signs ED Triage Vitals  Enc Vitals Group     BP 04/28/19 1333 131/83     Pulse Rate 04/28/19 1333 92     Resp 04/28/19 1333 18     Temp 04/28/19 1333 98.3 F (36.8 C)     Temp Source 04/28/19 1333 Oral     SpO2 04/28/19 1333 98 %     Weight 04/28/19 1349 238 lb (108 kg)     Height --      Head Circumference --      Peak Flow --      Pain Score 04/28/19 1349 0     Pain Loc --      Pain Edu? --      Excl. in [place]? --    No data found.  Updated Vital Signs BP 131/83 (BP Location: Left Arm)   Pulse 92   Temp 98.3 F (36.8 C) (Oral)   Resp 18   Wt 238 lb (108 kg)   SpO2 98%   BMI 35.15 kg/m   Visual Acuity Right Eye Distance:   Left Eye Distance:   Bilateral Distance:    Right Eye Near:   Left Eye Near:    Bilateral Near:     Physical Exam Vitals and nursing note reviewed.  Constitutional:      General: He is not in acute distress.    Appearance: He is well-developed. He is not ill-appearing.  HENT:     Head: Normocephalic and atraumatic.     Right Ear: Tympanic membrane normal.     Left Ear: Tympanic membrane normal.     Nose: Nose normal.     Mouth/Throat:     Mouth: Mucous membranes are moist.     Pharynx: Oropharynx is clear.  Eyes:     Conjunctiva/sclera: Conjunctivae normal.  Cardiovascular:     Rate and Rhythm: Normal rate and regular rhythm.     Heart sounds: No murmur.  Pulmonary:     Effort: Pulmonary effort is normal. No respiratory distress.     Breath sounds: Normal breath sounds.  Abdominal:     General: Bowel sounds are normal.     Palpations: Abdomen is soft.     Tenderness: There is no abdominal tenderness. There is no guarding or rebound.  Musculoskeletal:     Cervical back: Neck  supple.  Skin:    General: Skin is warm and dry.     Findings: No rash.  Neurological:     General: No focal deficit present.     Mental Status: He is alert and oriented to  person, place, and time.  Psychiatric:        Mood and Affect: Mood normal.        Behavior: Behavior normal.      UC Treatments / Results  Labs (all labs ordered are listed, but only abnormal results are displayed) Labs Reviewed  NOVEL CORONAVIRUS, NAA  POC SARS CORONAVIRUS 2 AG -  ED    EKG   Radiology No results found.  Procedures Procedures (including critical care time)  Medications Ordered in UC Medications - No data to display  Initial Impression / Assessment and Plan / UC Course  I have reviewed the triage vital signs and the nursing notes.  Pertinent labs & imaging results that were available during my care of the patient were reviewed by me and considered in my medical decision making (see chart for details).    Viral illness, cough.  Instructed patient to take over-the-counter Mucinex as needed.  POC COVID negative; PCR pending.  Instructed patient to self quarantine until the test result is back.  Instructed patient to go to the emergency department if he develops high fever, shortness of breath, severe diarrhea, or other concerning symptoms.  Patient agrees with plan of care.    Final Clinical Impressions(s) / UC Diagnoses   Final diagnoses:  Cough  Viral illness     Discharge Instructions     Take over-the-counter Mucinex as needed for your congestion.    Your rapid COVID test is negative; the send-out test is pending.  You should self quarantine until your test result is back and is negative.    Go to the emergency department if you develop high fever, shortness of breath, severe diarrhea, or other concerning symptoms.       ED Prescriptions    None     PDMP not reviewed this encounter.   Mickie Bailate, Cataleya Cristina H, NP 04/28/19 [phone]

## 2019-04-30 LAB — NOVEL CORONAVIRUS, NAA: SARS-CoV-2, NAA: NOT DETECTED

## 2019-07-16 ENCOUNTER — Ambulatory Visit: Payer: Managed Care, Other (non HMO) | Attending: Internal Medicine

## 2019-07-16 DIAGNOSIS — Z23 Encounter for immunization: Secondary | ICD-10-CM

## 2019-07-16 NOTE — Progress Notes (Signed)
   Covid-19 Vaccination Clinic  Name:  Ryan Dickerson    MRN: [id number] DOB: 05-May-1960  07/16/2019  Ryan Dickerson was observed post Covid-19 immunization for 15 minutes without incident. He was provided with Vaccine Information Sheet and instruction to access the V-Safe system.   Ryan Dickerson was instructed to call 911 with any severe reactions post vaccine: Marland Kitchen Difficulty breathing  . Swelling of face and throat  . A fast heartbeat  . A bad rash all over body  . Dizziness and weakness   Immunizations Administered    Name Date Dose VIS Date Route   Moderna COVID-19 Vaccine 07/16/2019 11:01 AM 0.5 mL 03/29/2019 Intramuscular   Manufacturer: Moderna   Lot: [id number]   NDC: 27782-423-53

## 2019-08-17 ENCOUNTER — Ambulatory Visit: Payer: Managed Care, Other (non HMO) | Attending: Internal Medicine

## 2019-08-17 DIAGNOSIS — Z23 Encounter for immunization: Secondary | ICD-10-CM

## 2019-08-17 NOTE — Progress Notes (Signed)
   Covid-19 Vaccination Clinic  Name:  Ryan Dickerson    MRN: [id number] DOB: [birth year]  08/17/2019  [name] was observed post Covid-19 immunization for 15 minutes without incident. He was provided with Vaccine Information Sheet and instruction to access the V-Safe system.   [name] was instructed to call 911 with any severe reactions post vaccine: Marland Kitchen Difficulty breathing  . Swelling of face and throat  . A fast heartbeat  . A bad rash all over body  . Dizziness and weakness   Immunizations Administered    Name Date Dose VIS Date Route   Moderna COVID-19 Vaccine 08/17/2019  2:28 PM 0.5 mL 03/2019 Intramuscular   Manufacturer: Moderna   Lot: [id number]   NDC: 34742-595-63

## 2020-01-12 IMAGING — CT CT ABD-PELV W/ CM
2 of 5 series · 16 of 46 positions shown, 18 images · IV contrast (iopamidol)
Comparison: None.

CLINICAL DATA: 57-year-old male with flank pain. Concern for stone
disease.

EXAM:
CT ABDOMEN AND PELVIS WITH CONTRAST
TECHNIQUE: Multidetector CT imaging of the abdomen and pelvis was performed
using the standard protocol following bolus administration of
intravenous contrast.
CONTRAST:  125mL ADA7N5-WBB IOPAMIDOL (ADA7N5-WBB) INJECTION 61%

[Series 2: routine abd/pel with · axial · 0.83mm/px · z∈[-1046,-586]mm · 13 of 104 slices shown, 15 images]
[im 6/104  soft-tissue]
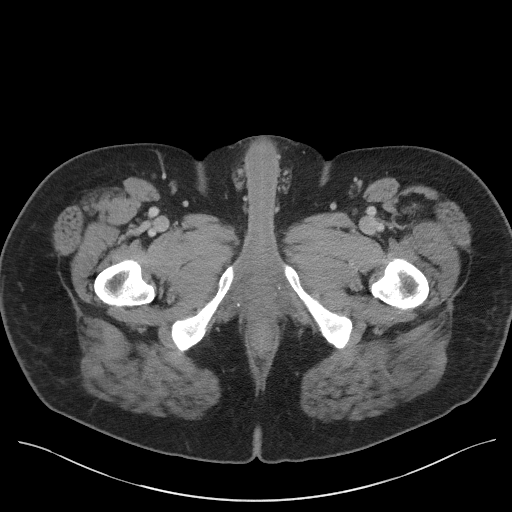
[im 6/104  bone]
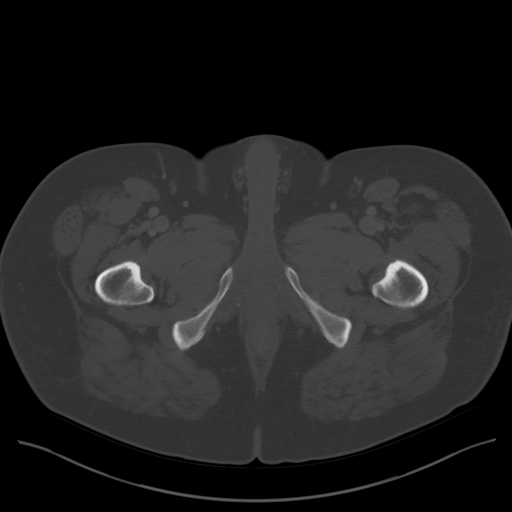
[im 17/104  soft-tissue]
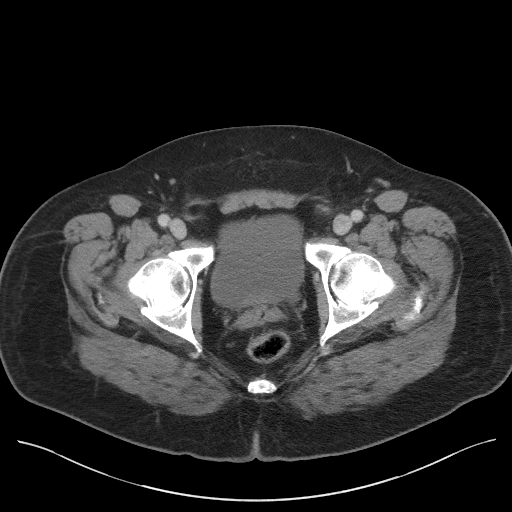
[im 22/104  soft-tissue]
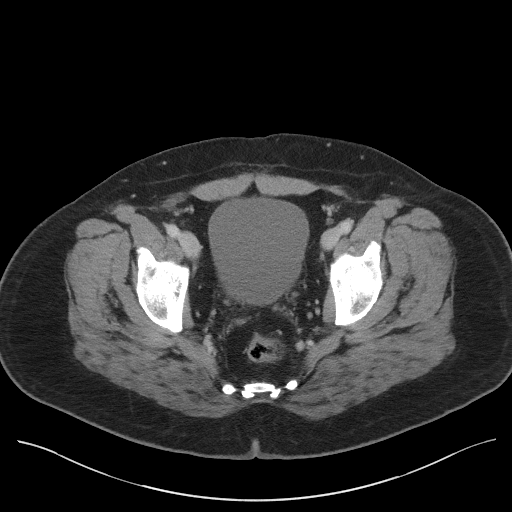
[im 28/104  soft-tissue]
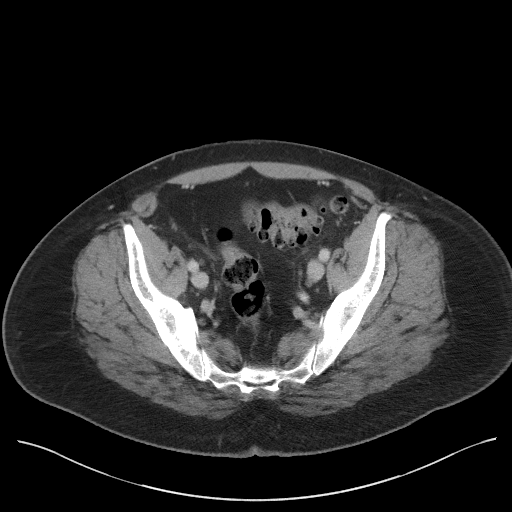
[im 38/104  soft-tissue]
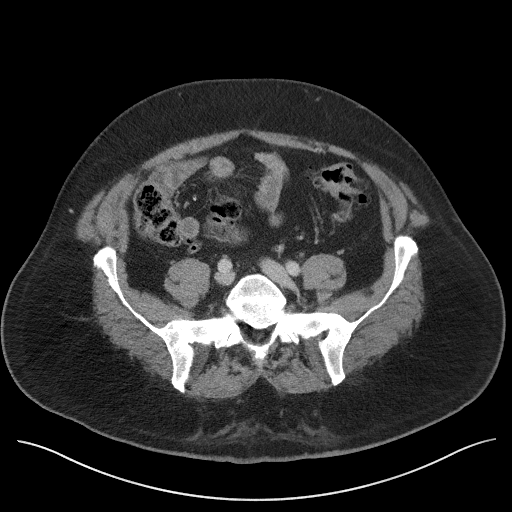
[im 44/104  soft-tissue]
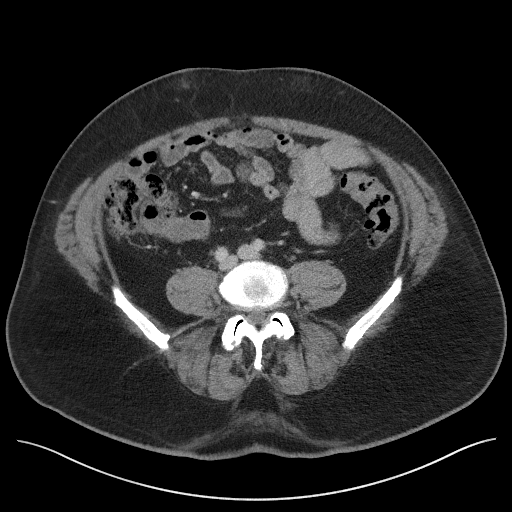
[im 55/104  soft-tissue]
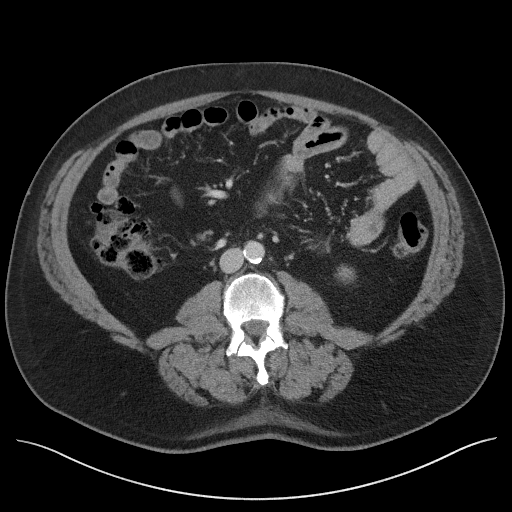
[im 60/104  soft-tissue]
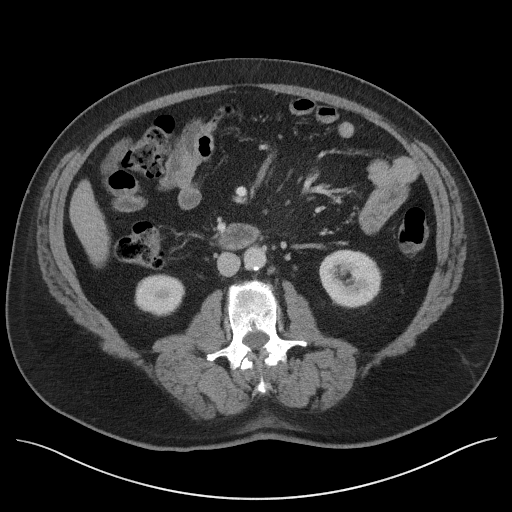
[im 66/104  soft-tissue]
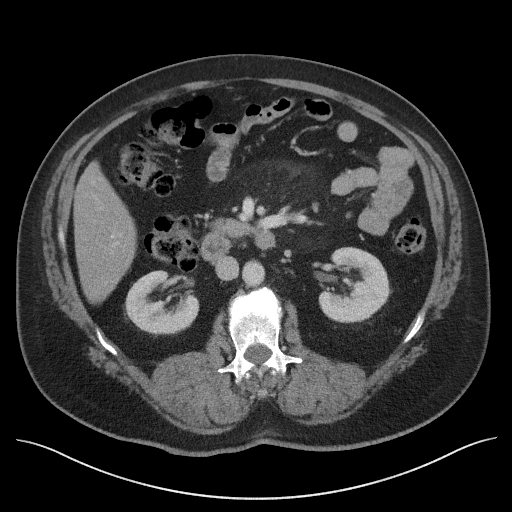
[im 66/104  bone]
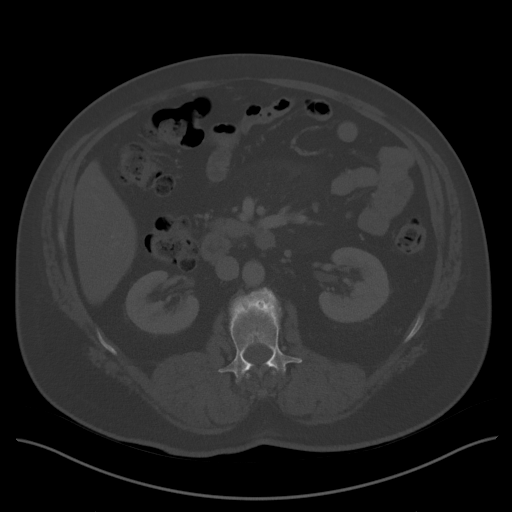
[im 76/104  soft-tissue]
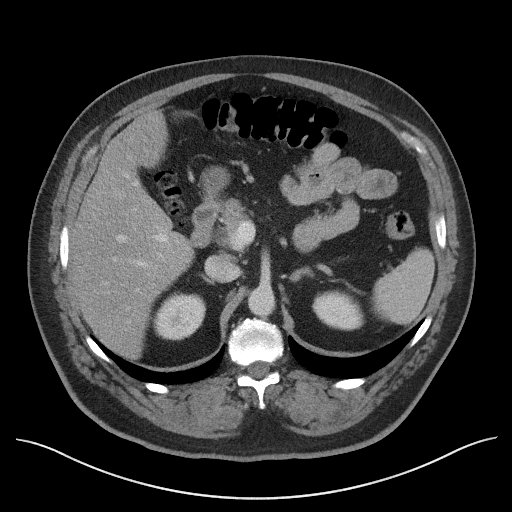
[im 82/104  soft-tissue]
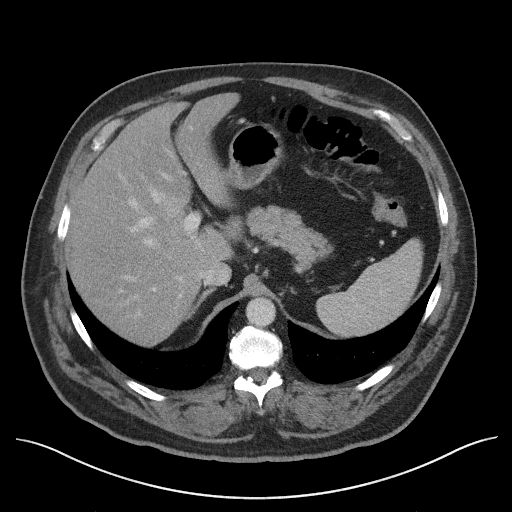
[im 87/104  soft-tissue]
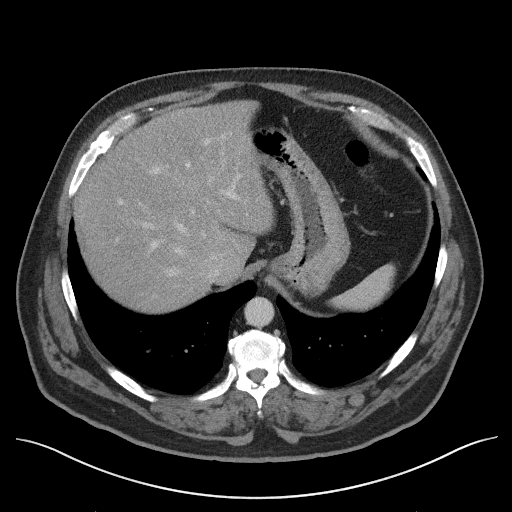
[im 98/104  soft-tissue]
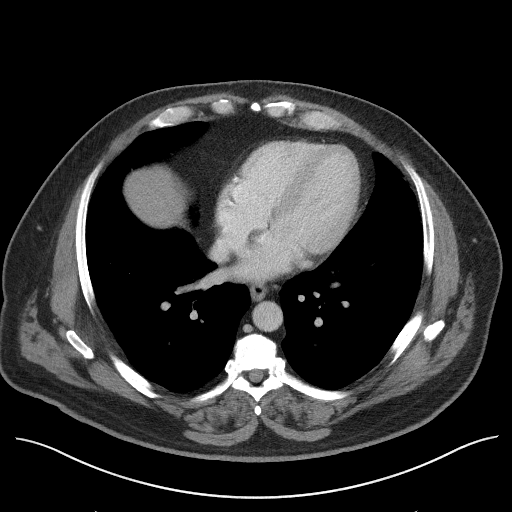

[Series 5: coronal st · coronal · 0.80mm/px · 3 of 108 slices shown]
[im 36/108  soft-tissue]
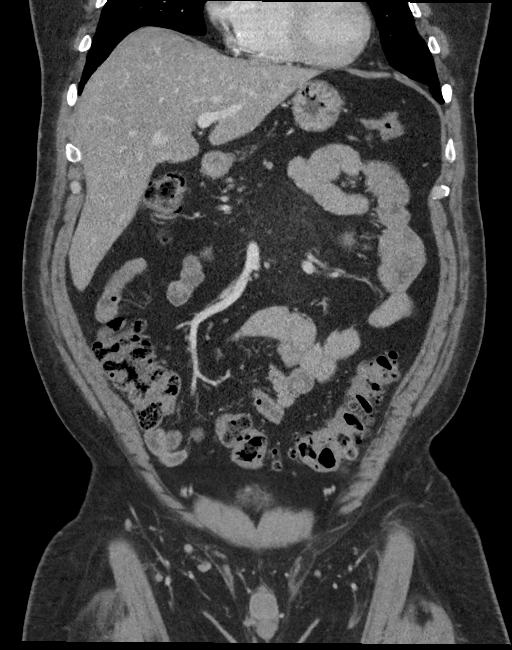
[im 48/108  soft-tissue]
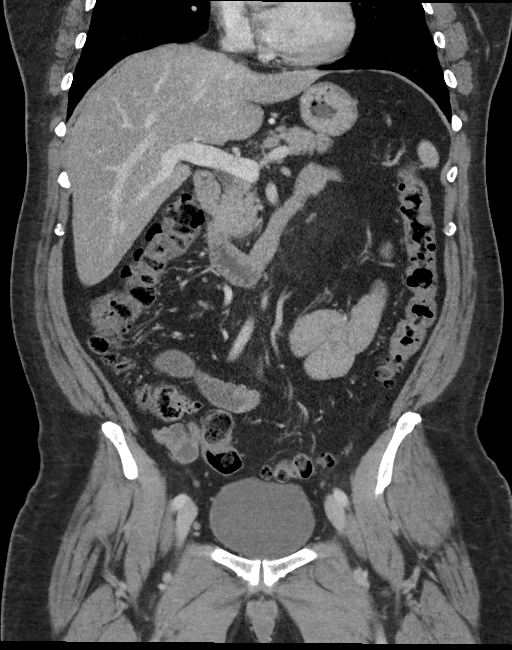
[im 60/108  soft-tissue]
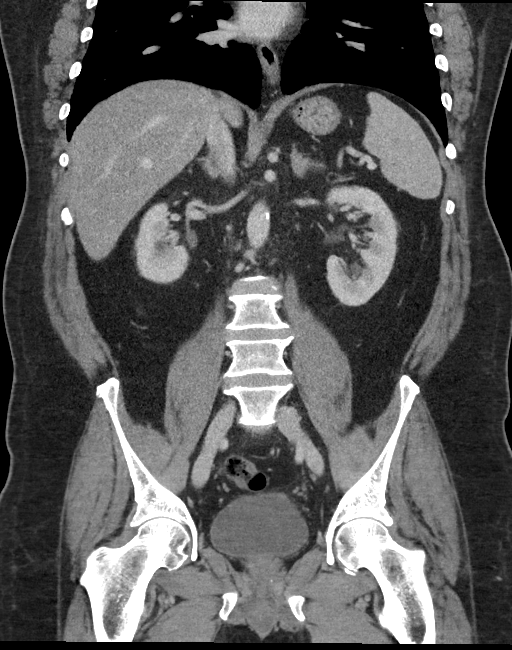

[16 of 46 positions shown; findings below may reference images not displayed]

FINDINGS: Lower chest: The visualized lung bases are clear. There is coronary
vascular calcification.

No intra-abdominal free air or free fluid.

Hepatobiliary: Probable fatty infiltration of the liver. No
intrahepatic biliary ductal dilatation. Cholecystectomy.

Pancreas: Unremarkable. No pancreatic ductal dilatation or
surrounding inflammatory changes.

Spleen: Normal in size without focal abnormality.

Adrenals/Urinary Tract: The adrenal glands are unremarkable.
Subcentimeter left renal hypodense lesion is too small to
characterize. There is no hydronephrosis on either side. There is
symmetric enhancement and excretion of contrast by both kidneys. The
visualized ureters appear unremarkable. Small left posterior bladder
[place] diverticulum.

Stomach/Bowel: Sigmoid diverticulosis without active inflammatory
changes. There is no bowel obstruction or active inflammation.
Normal appendix.

Vascular/Lymphatic: Mild aortoiliac atherosclerotic disease. No
portal venous gas. There is no adenopathy.

Reproductive: The prostate and seminal vesicles are grossly
unremarkable. No pelvic mass.

Other: None

Musculoskeletal: Lower lumbar facet arthropathy. No acute osseous
pathology.
IMPRESSION: 1. No acute intra-abdominal or pelvic pathology. No hydronephrosis.
2. Sigmoid diverticulosis. No bowel obstruction or active
inflammation. Normal appendix.

## 2022-03-04 DIAGNOSIS — E119 Type 2 diabetes mellitus without complications: Secondary | ICD-10-CM | POA: Insufficient documentation

## 2022-05-30 ENCOUNTER — Emergency Department: Payer: Managed Care, Other (non HMO)

## 2022-05-30 ENCOUNTER — Encounter: Payer: Self-pay | Admitting: Emergency Medicine

## 2022-05-30 ENCOUNTER — Emergency Department
Admission: EM | Admit: 2022-05-30 | Discharge: 2022-05-30 | Disposition: A | Discharge status: Home / Self Care | Payer: Managed Care, Other (non HMO) | Attending: Emergency Medicine | Admitting: Emergency Medicine

## 2022-05-30 ENCOUNTER — Other Ambulatory Visit: Payer: Self-pay

## 2022-05-30 DIAGNOSIS — Z85841 Personal history of malignant neoplasm of brain: Secondary | ICD-10-CM | POA: Insufficient documentation

## 2022-05-30 DIAGNOSIS — M25512 Pain in left shoulder: Secondary | ICD-10-CM | POA: Insufficient documentation

## 2022-05-30 DIAGNOSIS — S161XXA Strain of muscle, fascia and tendon at neck level, initial encounter: Secondary | ICD-10-CM | POA: Insufficient documentation

## 2022-05-30 DIAGNOSIS — E119 Type 2 diabetes mellitus without complications: Secondary | ICD-10-CM | POA: Insufficient documentation

## 2022-05-30 DIAGNOSIS — I10 Essential (primary) hypertension: Secondary | ICD-10-CM | POA: Insufficient documentation

## 2022-05-30 DIAGNOSIS — S39012A Strain of muscle, fascia and tendon of lower back, initial encounter: Secondary | ICD-10-CM | POA: Diagnosis not present

## 2022-05-30 DIAGNOSIS — S199XXA Unspecified injury of neck, initial encounter: Secondary | ICD-10-CM | POA: Diagnosis present

## 2022-05-30 DIAGNOSIS — Y92410 Unspecified street and highway as the place of occurrence of the external cause: Secondary | ICD-10-CM | POA: Diagnosis not present

## 2022-05-30 DIAGNOSIS — S0990XA Unspecified injury of head, initial encounter: Secondary | ICD-10-CM | POA: Diagnosis not present

## 2022-05-30 MED ORDER — HYDROCODONE-ACETAMINOPHEN 5-325 MG PO TABS: 1.0000 | ORAL_TABLET | Freq: Four times a day (QID) | ORAL | 0 refills | Status: DC | PRN

## 2022-05-30 MED ORDER — HYDROCODONE-ACETAMINOPHEN 5-325 MG PO TABS
1.0000 | ORAL_TABLET | Freq: Once | ORAL | Status: AC
  Administered 2022-05-30: 1 via ORAL
  Filled 2022-05-30: qty 1

## 2022-05-30 MED ORDER — MELOXICAM 15 MG PO TABS: 15.0000 mg | ORAL_TABLET | Freq: Every day | ORAL | 2 refills | Status: AC

## 2022-05-30 NOTE — Discharge Instructions (Signed)
Apply ice to every area that hurts for the next 3 days.  15 minutes at a time 3 times daily. In 3 days she can apply wet heat to the muscles and ice along the bony areas. Take the meloxicam daily.  Do not take ibuprofen or naproxen with this medication. Continue your regular muscle relaxer. Vicodin for pain that is not controlled by previous medications.
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
## 2022-11-08 LAB — LAB REPORT - SCANNED
A1c: 7
Albumin, Urine POC: 3.8
Creatinine, POC: 70.3 mg/dL
EGFR: 66
Microalb Creat Ratio: 5

## 2023-02-16 ENCOUNTER — Other Ambulatory Visit: Payer: Self-pay

## 2023-02-16 DIAGNOSIS — R1032 Left lower quadrant pain: Secondary | ICD-10-CM | POA: Diagnosis present

## 2023-02-16 DIAGNOSIS — K5732 Diverticulitis of large intestine without perforation or abscess without bleeding: Secondary | ICD-10-CM | POA: Diagnosis not present

## 2023-02-16 LAB — CBC
HCT: 42.9 % (ref 39.0–52.0)
Hemoglobin: 13.9 g/dL (ref 13.0–17.0)
MCH: 29.7 pg (ref 26.0–34.0)
MCHC: 32.4 g/dL (ref 30.0–36.0)
MCV: 91.7 fL (ref 80.0–100.0)
Platelets: 249 10*3/uL (ref 150–400)
RBC: 4.68 MIL/uL (ref 4.22–5.81)
RDW: 14.5 % (ref 11.5–15.5)
WBC: 11.6 10*3/uL — ABNORMAL HIGH (ref 4.0–10.5)
nRBC: 0 % (ref 0.0–0.2)

## 2023-02-16 LAB — URINALYSIS, ROUTINE W REFLEX MICROSCOPIC
Bacteria, UA: NONE SEEN
Bilirubin Urine: NEGATIVE
Glucose, UA: >=500 mg/dL — AB
Ketones, ur: NEGATIVE mg/dL
Leukocytes,Ua: NEGATIVE
Nitrite: NEGATIVE
Protein, ur: NEGATIVE mg/dL
Specific Gravity, Urine: 1.021 (ref 1.005–1.030)
Squamous Epithelial / HPF: 0 /HPF (ref 0–5)
pH: 5 (ref 5.0–8.0)

## 2023-02-16 LAB — COMPREHENSIVE METABOLIC PANEL
ALT: 17 U/L (ref 0–44)
AST: 16 U/L (ref 15–41)
Albumin: 4.5 g/dL (ref 3.5–5.0)
Alkaline Phosphatase: 64 U/L (ref 38–126)
Anion gap: 9 (ref 5–15)
BUN: 24 mg/dL — ABNORMAL HIGH (ref 8–23)
CO2: 22 mmol/L (ref 22–32)
Calcium: 8.9 mg/dL (ref 8.9–10.3)
Chloride: 107 mmol/L (ref 98–111)
Creatinine, Ser: 1.1 mg/dL (ref 0.61–1.24)
GFR, Estimated: >60 mL/min (ref >60)
Glucose, Bld: 132 mg/dL — ABNORMAL HIGH (ref 70–99)
Potassium: 4.1 mmol/L (ref 3.5–5.1)
Sodium: 138 mmol/L (ref 135–145)
Total Bilirubin: 0.9 mg/dL (ref 0.3–1.2)
Total Protein: 7.7 g/dL (ref 6.5–8.1)

## 2023-02-16 LAB — LIPASE, BLOOD: Lipase: 25 U/L (ref 11–51)

## 2023-02-16 NOTE — ED Triage Notes (Signed)
Patient ambulatory to triage with complaints of left lower abdominal pain x2 days that worsened [place].  Patient states he's had normal bowel movements including one this morning. Endorses previous hx of pancreatitis but states this feels worse. Denies recent vomiting or fevers

## 2023-02-17 ENCOUNTER — Emergency Department
Admission: EM | Admit: 2023-02-17 | Discharge: 2023-02-17 | Disposition: A | Discharge status: Home / Self Care | Payer: Managed Care, Other (non HMO) | Attending: Emergency Medicine | Admitting: Emergency Medicine

## 2023-02-17 ENCOUNTER — Emergency Department: Payer: Managed Care, Other (non HMO)

## 2023-02-17 DIAGNOSIS — K5792 Diverticulitis of intestine, part unspecified, without perforation or abscess without bleeding: Secondary | ICD-10-CM

## 2023-02-17 MED ORDER — AMOXICILLIN-POT CLAVULANATE 875-125 MG PO TABS
1.0000 | ORAL_TABLET | Freq: Once | ORAL | Status: AC
  Administered 2023-02-17: 1 via ORAL
  Filled 2023-02-17: qty 1

## 2023-02-17 MED ORDER — OXYCODONE-ACETAMINOPHEN 5-325 MG PO TABS: 1.0000 | ORAL_TABLET | Freq: Three times a day (TID) | ORAL | 0 refills | Status: DC | PRN

## 2023-02-17 MED ORDER — AMOXICILLIN-POT CLAVULANATE 875-125 MG PO TABS: 1.0000 | ORAL_TABLET | Freq: Two times a day (BID) | ORAL | 0 refills | Status: AC

## 2023-02-17 MED ORDER — KETOROLAC TROMETHAMINE 30 MG/ML IJ SOLN
30.0000 mg | Freq: Once | INTRAMUSCULAR | Status: AC
  Administered 2023-02-17: 30 mg via INTRAMUSCULAR
  Filled 2023-02-17: qty 1

## 2023-02-17 NOTE — ED Provider Notes (Signed)
[facility] Provider Note    Event Date/Time   First MD Initiated Contact with Patient 02/17/23 0030     (approximate)   History   Abdominal Pain   HPI  Ryan Dickerson is a 62 y.o. male who presents with complaints of left lower quadrant abdominal pain for about 2 days.  Worsens significantly today.  He reports the pain was severe at times.  Positive nausea.  Normal bowel movements.  No history of kidney stones.  No history of abdominal surgery.     Physical Exam   Triage Vital Signs: ED Triage Vitals  Encounter Vitals Group     BP 02/16/23 2059 (!) 146/70     Systolic BP Percentile --      Diastolic BP Percentile --      Pulse Rate 02/16/23 2059 98     Resp 02/16/23 2059 18     Temp 02/16/23 2059 98.6 F (37 C)     Temp Source 02/16/23 2059 Oral     SpO2 02/16/23 2059 97 %     Weight 02/16/23 2100 108.9 kg (240 lb)     Height 02/16/23 2100 1.753 m (5\' 9" )     Head Circumference --      Peak Flow --      Pain Score 02/16/23 2059 10     Pain Loc --      Pain Education --      Exclude from Growth Chart --     Most recent vital signs: Vitals:   02/16/23 2059 02/17/23 0217  BP: (!) 146/70 (!) 129/56  Pulse: 98 80  Resp: 18 16  Temp: 98.6 F (37 C)   SpO2: 97% 97%     General: Awake, no distress.  CV:  Good peripheral perfusion.  Resp:  Normal effort.  Abd:  No distention.  Tenderness palpation left lower quadrant, no CVA tenderness Other:     ED Results / Procedures / Treatments   Labs (all labs ordered are listed, but only abnormal results are displayed) Labs Reviewed  COMPREHENSIVE METABOLIC PANEL - Abnormal; Notable for the following components:      Result Value   Glucose, Bld 132 (*)    BUN 24 (*)    All other components within normal limits  CBC - Abnormal; Notable for the following components:   WBC 11.6 (*)    All other components within normal limits  URINALYSIS, ROUTINE W REFLEX MICROSCOPIC - Abnormal;  Notable for the following components:   Color, Urine YELLOW (*)    APPearance CLEAR (*)    Glucose, UA >=500 (*)    Hgb urine dipstick SMALL (*)    All other components within normal limits  LIPASE, BLOOD     EKG     RADIOLOGY CT renal stone study consistent with diverticulitis    PROCEDURES:  Critical Care performed:   Procedures   MEDICATIONS ORDERED IN ED: Medications  ketorolac (TORADOL) 30 MG/ML injection 30 mg (30 mg Intramuscular Given 02/17/23 0055)  amoxicillin-clavulanate (AUGMENTIN) 875-125 MG per tablet 1 tablet (1 tablet Oral Given 02/17/23 0216)     IMPRESSION / MDM / ASSESSMENT AND PLAN / ED COURSE  I reviewed the triage vital signs and the nursing notes. Patient's presentation is most consistent with acute presentation with potential threat to life or bodily function.  Patient presents with left lower quadrant abdominal pain, differential includes diverticulitis, UTI, ureterolithiasis.  Primarily suspect diverticulitis, will obtain CT renal stone study, will  treat with IM Toradol  Lab work reviewed demonstrates mild elevation of white blood cell count, urinalysis overall reassuring, small amount of hemoglobin noted  CT scan is consistent with uncomplicated diverticulitis, will start Augmentin, analgesics, outpatient follow-up, return precautions if no improvement, patient agrees to this plan.        FINAL CLINICAL IMPRESSION(S) / ED DIAGNOSES   Final diagnoses:  Diverticulitis     Rx / DC Orders   ED Discharge Orders          Ordered    amoxicillin-clavulanate (AUGMENTIN) 875-125 MG tablet  2 times daily        02/17/23 0212    oxyCODONE-acetaminophen (PERCOCET) 5-325 MG tablet  Every 8 hours PRN        02/17/23 0212             Note:  This document was prepared using Dragon voice recognition software and may include unintentional dictation errors.   Jene Every, MD 02/17/23 [phone]

## 2023-03-14 LAB — LAB REPORT - SCANNED
A1c: 7.2
EGFR: 75

## 2023-07-18 LAB — LAB REPORT - SCANNED
A1c: 6.9
EGFR: 74

## 2023-10-20 ENCOUNTER — Encounter (HOSPITAL_COMMUNITY): Payer: Self-pay | Admitting: Emergency Medicine

## 2023-10-20 ENCOUNTER — Other Ambulatory Visit: Payer: Self-pay

## 2023-10-20 ENCOUNTER — Emergency Department (HOSPITAL_COMMUNITY)
Admission: EM | Admit: 2023-10-20 | Discharge: 2023-10-20 | Disposition: A | Discharge status: Home / Self Care | Attending: Emergency Medicine | Admitting: Emergency Medicine

## 2023-10-20 ENCOUNTER — Emergency Department (HOSPITAL_COMMUNITY)

## 2023-10-20 DIAGNOSIS — T782XXA Anaphylactic shock, unspecified, initial encounter: Secondary | ICD-10-CM | POA: Insufficient documentation

## 2023-10-20 DIAGNOSIS — Z794 Long term (current) use of insulin: Secondary | ICD-10-CM | POA: Insufficient documentation

## 2023-10-20 DIAGNOSIS — Z87891 Personal history of nicotine dependence: Secondary | ICD-10-CM | POA: Diagnosis not present

## 2023-10-20 DIAGNOSIS — Z7984 Long term (current) use of oral hypoglycemic drugs: Secondary | ICD-10-CM | POA: Insufficient documentation

## 2023-10-20 DIAGNOSIS — E119 Type 2 diabetes mellitus without complications: Secondary | ICD-10-CM | POA: Diagnosis not present

## 2023-10-20 DIAGNOSIS — T7840XA Allergy, unspecified, initial encounter: Secondary | ICD-10-CM | POA: Diagnosis present

## 2023-10-20 DIAGNOSIS — Z7982 Long term (current) use of aspirin: Secondary | ICD-10-CM | POA: Diagnosis not present

## 2023-10-20 LAB — CBC WITH DIFFERENTIAL/PLATELET
Abs Immature Granulocytes: 0.11 10*3/uL — ABNORMAL HIGH (ref 0.00–0.07)
Basophils Absolute: 0 10*3/uL (ref 0.0–0.1)
Basophils Relative: 0 %
Eosinophils Absolute: 0 10*3/uL (ref 0.0–0.5)
Eosinophils Relative: 0 %
HCT: 48.9 % (ref 39.0–52.0)
Hemoglobin: 16.2 g/dL (ref 13.0–17.0)
Immature Granulocytes: 1 %
Lymphocytes Relative: 21 %
Lymphs Abs: 4 10*3/uL (ref 0.7–4.0)
MCH: 29.9 pg (ref 26.0–34.0)
MCHC: 33.1 g/dL (ref 30.0–36.0)
MCV: 90.2 fL (ref 80.0–100.0)
Monocytes Absolute: 1.3 10*3/uL — ABNORMAL HIGH (ref 0.1–1.0)
Monocytes Relative: 7 %
Neutro Abs: 13.7 10*3/uL — ABNORMAL HIGH (ref 1.7–7.7)
Neutrophils Relative %: 71 %
Platelets: 279 10*3/uL (ref 150–400)
RBC: 5.42 MIL/uL (ref 4.22–5.81)
RDW: 13.9 % (ref 11.5–15.5)
WBC: 19.1 10*3/uL — ABNORMAL HIGH (ref 4.0–10.5)
nRBC: 0 % (ref 0.0–0.2)

## 2023-10-20 LAB — BASIC METABOLIC PANEL WITH GFR
Anion gap: 14 (ref 5–15)
BUN: 22 mg/dL (ref 8–23)
CO2: 20 mmol/L — ABNORMAL LOW (ref 22–32)
Calcium: 8.7 mg/dL — ABNORMAL LOW (ref 8.9–10.3)
Chloride: 108 mmol/L (ref 98–111)
Creatinine, Ser: 1.37 mg/dL — ABNORMAL HIGH (ref 0.61–1.24)
GFR, Estimated: 57 mL/min — ABNORMAL LOW (ref >60)
Glucose, Bld: 173 mg/dL — ABNORMAL HIGH (ref 70–99)
Potassium: 3.8 mmol/L (ref 3.5–5.1)
Sodium: 142 mmol/L (ref 135–145)

## 2023-10-20 MED ORDER — HYDROCODONE-ACETAMINOPHEN 5-325 MG PO TABS: 1.0000 | ORAL_TABLET | ORAL | 0 refills | Status: DC | PRN

## 2023-10-20 MED ORDER — EPINEPHRINE 0.3 MG/0.3ML IJ SOAJ: 0.3000 mg | INTRAMUSCULAR | 0 refills | Status: AC | PRN

## 2023-10-20 MED ORDER — SODIUM CHLORIDE 0.9 % IV BOLUS
1000.0000 mL | Freq: Once | INTRAVENOUS | Status: AC
Start: 2023-10-20 — End: 2023-10-20
  Administered 2023-10-20: 1000 mL via INTRAVENOUS

## 2023-10-20 MED ORDER — HYDROCODONE-ACETAMINOPHEN 5-325 MG PO TABS
1.0000 | ORAL_TABLET | Freq: Once | ORAL | Status: AC
  Administered 2023-10-20: 1 via ORAL
  Filled 2023-10-20: qty 1

## 2023-10-20 MED ORDER — LORATADINE 10 MG PO TABS: 10.0000 mg | ORAL_TABLET | Freq: Every day | ORAL | 0 refills | Status: DC

## 2023-10-20 MED ORDER — FAMOTIDINE IN NACL 20-0.9 MG/50ML-% IV SOLN
20.0000 mg | Freq: Once | INTRAVENOUS | Status: AC
  Administered 2023-10-20: 20 mg via INTRAVENOUS
  Filled 2023-10-20: qty 50

## 2023-10-20 MED ORDER — METHYLPREDNISOLONE SODIUM SUCC 125 MG IJ SOLR
125.0000 mg | Freq: Once | INTRAMUSCULAR | Status: AC
  Administered 2023-10-20: 125 mg via INTRAVENOUS
  Filled 2023-10-20: qty 2

## 2023-10-20 MED ORDER — PREDNISONE 10 MG (21) PO TBPK: ORAL_TABLET | Freq: Every day | ORAL | 0 refills | Status: DC

## 2023-10-20 MED ORDER — KETOROLAC TROMETHAMINE 15 MG/ML IJ SOLN
15.0000 mg | Freq: Once | INTRAMUSCULAR | Status: AC
  Administered 2023-10-20: 15 mg via INTRAVENOUS
  Filled 2023-10-20: qty 1

## 2023-10-20 NOTE — Discharge Instructions (Signed)
 Please follow-up with your primary doctor(s) within 2-3 days. Please avoid any known triggers of your allergies.  We have given you a prescription for an Epi-Pen. Please pick it up as soon as possible. Always carry this with you. Only use the Epi-Pen in the event of a severe allergic reaction with trouble breathing or throat swelling. You must go to the hospital right away if you ever use the Epi-Pen. Remember that they expire every year so you should have your doctor write a new prescription yearly. We have also given you a prescription for prednisone and an antihistamine. Please pick it up as soon as possible and use as directed.    Please return to the Emergency Department right away if you have any worsening or new shortness of breath, changes in your voice, tightness/itching in your mouth/throat, swelling, severe hives, chest pain, high fever. There is a very small chance of a recurrence of the allergic reaction, typically in the next 24 hours. If you see the same symptoms (rash, trouble breathing, vomiting, etc) return, come back to the Emergency Department immediately.   Please return to the emergency department immediately for any new or concerning symptoms, or if you get worse.

## 2023-10-20 NOTE — ED Provider Notes (Signed)
 [place] EMERGENCY DEPARTMENT AT [facility] Provider Note  CSN: [id number] Arrival date & time: 10/20/23 2000  Chief Complaint(s) Allergic Reaction  HPI Ryan Dickerson is a 63 y.o. male with past medical history as below, significant for DM, pancreatitis, cholecystitis who presents to the ED with complaint of bee sting  Patient reports he was cutting the grass, he inadvertently drove over a yellowjacket nest and was stung multiple times by jackets.  Stings to his legs and under his pants, back.  No known allergies.  He took Benadryl prior to arrival, he was given Benadryl the fire station, EMS gave him IM epi x 2 and some fluids.  He reports minimal improvement to his symptoms.  He has nausea without vomiting, feels like his throat is scratchy, he has pain at the site of the bee stings.  Normal state of health prior to incident  Past Medical History Past Medical History:  Diagnosis Date   Diabetes mellitus without complication (HCC)    Pancreatitis 2007   Sternal fracture    Patient Active Problem List   Diagnosis Date Noted   Calculus of gallbladder with other cholecystitis, without mention of obstruction 10/20/2012   Cholecystitis without calculus 10/06/2012   Home Medication(s) Prior to Admission medications   Medication Sig Start Date End Date Taking? Authorizing Provider  EPINEPHrine 0.3 mg/0.3 mL IJ SOAJ injection Inject 0.3 mg into the muscle as needed for anaphylaxis. 10/20/23  Yes Elnor Jayson LABOR, DO  HYDROcodone -acetaminophen  (NORCO/VICODIN) 5-325 MG tablet Take 1 tablet by mouth every 4 (four) hours as needed for moderate pain (pain score 4-6) or severe pain (pain score 7-10). 10/20/23  Yes Elnor Jayson A, DO  loratadine (CLARITIN) 10 MG tablet Take 1 tablet (10 mg total) by mouth daily. 10/20/23  Yes Elnor Jayson A, DO  predniSONE (STERAPRED UNI-PAK 21 TAB) 10 MG (21) TBPK tablet Take by mouth daily. Take 6 tabs by mouth daily  for 2 days, then 5 tabs for 2 days,  then 4 tabs for 2 days, then 3 tabs for 2 days, 2 tabs for 2 days, then 1 tab by mouth daily for 2 days 10/21/23  Yes Elnor Jayson A, DO  aspirin 81 MG tablet Take 81 mg by mouth daily.    [provider]  clonazePAM (KLONOPIN) 1 MG tablet Take 1 mg by mouth daily.    [provider]  Insulin Glargine (TOUJEO SOLOSTAR) 300 UNIT/ML SOPN Inject 45 Units into the skin at bedtime. 01/18/15   [provider]  lisinopril (PRINIVIL,ZESTRIL) 5 MG tablet Take 5 mg by mouth daily. 11/01/13   [provider]  metFORMIN (GLUCOPHAGE) 500 MG tablet Take 1,000 mg by mouth 2 (two) times daily.    [provider]  Omega 3-6-9 CAPS Take 1,200 mg by mouth 2 (two) times daily.    [provider]  pioglitazone (ACTOS) 30 MG tablet Take 30 mg by mouth daily. 01/18/15 01/18/16  [provider]  TOUJEO SOLOSTAR 300 UNIT/ML [facility]  03/09/19   [provider]  Past Surgical History Past Surgical History:  Procedure Laterality Date   BRAIN SURGERY  1972   tumor   CHOLECYSTECTOMY  10-11-12   COLONOSCOPY  12-15-08   Dr Dessa   COLONOSCOPY WITH PROPOFOL  N/A 04/06/2019   Procedure: COLONOSCOPY WITH PROPOFOL ;  Surgeon: Dessa Reyes ORN, MD;  Location: [facility] ENDOSCOPY;  Service: Endoscopy;  Laterality: N/A;   Family History Family History  Problem Relation Age of Onset   Diabetes Father    Stroke Father     Social History Social History   Tobacco Use   Smoking status: Former    Current packs/day: 0.00    Average packs/day: 0.5 packs/day for 30.0 years (15.0 ttl pk-yrs)    Types: Cigarettes    Start date: 04/29/1983    Quit date: 04/28/2013    Years since quitting: 10.4   Smokeless tobacco: Never  Vaping Use   Vaping status: Never Used  Substance Use Topics   Alcohol use: No    Alcohol/week: 0.0 standard drinks of  alcohol   Drug use: No   Allergies Patient has no known allergies.  Review of Systems A thorough review of systems was obtained and all systems are negative except as noted in the HPI and PMH.   Physical Exam Vital Signs  I have reviewed the triage vital signs BP 127/78   Pulse (!) 102   Temp 97.6 F (36.4 C) (Oral)   Resp 16   Ht 5' 9 (1.753 m)   Wt 106.6 kg   SpO2 97%   BMI 34.70 kg/m  Physical Exam Vitals and nursing note reviewed.  Constitutional:      General: He is not in acute distress.    Appearance: He is well-developed. He is obese.  HENT:     Head: Normocephalic and atraumatic.     Comments: No angioedema, no drooling stridor or trismus    Right Ear: External ear normal.     Left Ear: External ear normal.     Mouth/Throat:     Mouth: Mucous membranes are moist.   Eyes:     General: No scleral icterus.   Cardiovascular:     Rate and Rhythm: Normal rate and regular rhythm.     Pulses: Normal pulses.     Heart sounds: Normal heart sounds.  Pulmonary:     Effort: Pulmonary effort is normal. No respiratory distress.     Breath sounds: Normal breath sounds.  Abdominal:     General: Abdomen is flat.     Palpations: Abdomen is soft.     Tenderness: There is no abdominal tenderness. There is no guarding.   Musculoskeletal:     Cervical back: No rigidity.     Right lower leg: No edema.     Left lower leg: No edema.   Skin:    General: Skin is warm and dry.     Capillary Refill: Capillary refill takes less than 2 seconds.     Comments: Hives diffusely   Neurological:     Mental Status: He is alert.   Psychiatric:        Mood and Affect: Mood normal.        Behavior: Behavior normal.     ED Results and Treatments Labs (all labs ordered are listed, but only abnormal results are displayed) Labs Reviewed  CBC WITH DIFFERENTIAL/PLATELET - Abnormal; Notable for the following components:      Result Value   WBC 19.1 (*)    Neutro Abs 13.7 (*)  Monocytes Absolute 1.3 (*)    Abs Immature Granulocytes 0.11 (*)    All other components within normal limits  BASIC METABOLIC PANEL WITH GFR - Abnormal; Notable for the following components:   CO2 20 (*)    Glucose, Bld 173 (*)    Creatinine, Ser 1.37 (*)    Calcium 8.7 (*)    GFR, Estimated 57 (*)    All other components within normal limits                                                                                                                          Radiology DG Chest Portable 1 View Result Date: 10/20/2023 CLINICAL DATA:  Shortness of the is EXAM: PORTABLE CHEST 1 VIEW COMPARISON:  Is chest x-ray 10/13/2016 FINDINGS: The heart size and mediastinal contours are within normal limits. Both lungs are clear. The visualized skeletal structures are unremarkable. IMPRESSION: No active disease. Electronically Signed   By: Greig Pique M.D.   On: 10/20/2023 21:27    Pertinent labs & imaging results that were available during my care of the patient were reviewed by me and considered in my medical decision making (see MDM for details).  Medications Ordered in ED Medications  sodium chloride  0.9 % bolus 1,000 mL (0 mLs Intravenous Stopped 10/20/23 2125)  methylPREDNISolone sodium succinate (SOLU-MEDROL) 125 mg/2 mL injection 125 mg (125 mg Intravenous Given 10/20/23 2025)  famotidine (PEPCID) IVPB 20 mg premix (0 mg Intravenous Stopped 10/20/23 2106)  ketorolac  (TORADOL ) 15 MG/ML injection 15 mg (15 mg Intravenous Given 10/20/23 2025)  HYDROcodone -acetaminophen  (NORCO/VICODIN) 5-325 MG per tablet 1 tablet (1 tablet Oral Given 10/20/23 2124)                                                                                                                                     Procedures Procedures  (including critical care time)  Medical Decision Making / ED Course    Medical Decision Making:    PLUMER MITTELSTAEDT is a 63 y.o. male with past medical history as below, significant for DM,  pancreatitis, cholecystitis who presents to the ED with complaint of bee sting. The complaint involves an extensive differential diagnosis and also carries with it a high risk of complications and morbidity.  Serious etiology was considered. Ddx includes but is not limited to: Anaphylaxis, allergic reaction, envenomation, contact dermatitis, etc.  Complete initial physical  exam performed, notably the patient was in no acute distress, no hypoxia, blood pressure is stable Reviewed and confirmed nursing documentation for past medical history, family history, social history.  Vital signs reviewed.     Brief summary:  63 year old male history above here following multiple yellowjacket sting. He has diffuse urticaria, mildly improved after epinephrine. Give Solu-Medrol, Pepcid, IV fluids, check screening labs.  Low threshold to repeat IM epi    Clinical Course as of 10/20/23 2258  Tue Oct 20, 2023  2205 Feeling better, breathing comfortably on room air [SG]  2205 WBC(!): 19.1 Likely 2/2 de margination from anaphylaxis, epinephrine  [SG]  2250 Continued improvement, hives nearly gone, he is breathing comfortably on ambient air.  [SG]    Clinical Course User Index [SG] Elnor Jayson LABOR, DO     On recheck he is feeling much better Heart rate improving, likely elevated secondary to anaphylaxis, epinephrine dosing Will discharge patient with steroids, antihistamine, EpiPen.  Advised follow-up with PCP, given allergy clinic information.  Advised to avoid yellowjacket in the future  Patient in no distress and overall condition is stable. Detailed discussions were had with the patient/guardian regarding current findings, and need for close f/u with PCP or on call doctor. The patient/guardian has been instructed to return immediately if the symptoms worsen in any way for re-evaluation. Patient/guardian verbalized understanding and is in agreement with current care plan. All questions answered prior to  discharge.             Additional history obtained: -Additional history obtained from ems -External records from outside source obtained and reviewed including: Chart review including previous notes, labs, imaging, consultation notes including  Prior ER evaluation, prior labs   Lab Tests: -I ordered, reviewed, and interpreted labs.   The pertinent results include:   Labs Reviewed  CBC WITH DIFFERENTIAL/PLATELET - Abnormal; Notable for the following components:      Result Value   WBC 19.1 (*)    Neutro Abs 13.7 (*)    Monocytes Absolute 1.3 (*)    Abs Immature Granulocytes 0.11 (*)    All other components within normal limits  BASIC METABOLIC PANEL WITH GFR - Abnormal; Notable for the following components:   CO2 20 (*)    Glucose, Bld 173 (*)    Creatinine, Ser 1.37 (*)    Calcium 8.7 (*)    GFR, Estimated 57 (*)    All other components within normal limits    Notable for leukocytosis  EKG   EKG Interpretation Date/Time:  Tuesday October 20 2023 20:04:46 EDT Ventricular Rate:  108 PR Interval:  143 QRS Duration:  116 QT Interval:  353 QTC Calculation: 474 R Axis:   264  Text Interpretation: Sinus tachycardia Probable left atrial enlargement Incomplete right bundle branch block Confirmed by Elnor Jayson ([id number]) on 10/20/2023 8:13:07 PM         Imaging Studies ordered: I ordered imaging studies including CXR I independently visualized the following imaging with scope of interpretation limited to determining acute life threatening conditions related to emergency care; findings noted above I agree with the radiologist interpretation If any imaging was obtained with contrast I closely monitored patient for any possible adverse reaction a/w contrast administration in the emergency department   Medicines ordered and prescription drug management: Meds ordered this encounter  Medications   sodium chloride  0.9 % bolus 1,000 mL   methylPREDNISolone sodium succinate  (SOLU-MEDROL) 125 mg/2 mL injection 125 mg   famotidine (PEPCID) IVPB 20  mg premix   ketorolac  (TORADOL ) 15 MG/ML injection 15 mg   HYDROcodone -acetaminophen  (NORCO/VICODIN) 5-325 MG per tablet 1 tablet    Refill:  0   EPINEPHrine 0.3 mg/0.3 mL IJ SOAJ injection    Sig: Inject 0.3 mg into the muscle as needed for anaphylaxis.    Dispense:  1 each    Refill:  0   predniSONE (STERAPRED UNI-PAK 21 TAB) 10 MG (21) TBPK tablet    Sig: Take by mouth daily. Take 6 tabs by mouth daily  for 2 days, then 5 tabs for 2 days, then 4 tabs for 2 days, then 3 tabs for 2 days, 2 tabs for 2 days, then 1 tab by mouth daily for 2 days    Dispense:  42 tablet    Refill:  0   loratadine (CLARITIN) 10 MG tablet    Sig: Take 1 tablet (10 mg total) by mouth daily.    Dispense:  30 tablet    Refill:  0   HYDROcodone -acetaminophen  (NORCO/VICODIN) 5-325 MG tablet    Sig: Take 1 tablet by mouth every 4 (four) hours as needed for moderate pain (pain score 4-6) or severe pain (pain score 7-10).    Dispense:  6 tablet    Refill:  0    -I have reviewed the patients home medicines and have made adjustments as needed   Consultations Obtained: na   Cardiac Monitoring: The patient was maintained on a cardiac monitor.  I personally viewed and interpreted the cardiac monitored which showed an underlying rhythm of: NSR Continuous pulse oximetry interpreted by myself, 99% on RA.    Social Determinants of Health:  Diagnosis or treatment significantly limited by social determinants of health: former smoker and obesity   Reevaluation: After the interventions noted above, I reevaluated the patient and found that they have improved  Co morbidities that complicate the patient evaluation  Past Medical History:  Diagnosis Date   Diabetes mellitus without complication (HCC)    Pancreatitis 2007   Sternal fracture       Dispostion: Disposition decision including need for hospitalization was considered, and patient  discharged from emergency department.    Final Clinical Impression(s) / ED Diagnoses Final diagnoses:  Anaphylaxis, initial encounter        Elnor Jayson LABOR, DO 10/20/23 2259

## 2023-10-20 NOTE — ED Triage Notes (Signed)
 Pt was mowing when he ran over a yellow jacket's nest. Pt estimated to be stung around 8-10 times. Pt denies any allergies. Pt reports nausea and slightly SOA. Pt RA O2 sat was 92%. Last epi dose was 1939. Pt noted to have scattered red rash ranging from legs to upper chest. Pt Aox4. Airwya patent with no work of breathing noted.

## 2023-12-21 LAB — LIPID PANEL
Cholesterol: 109 (ref 0–200)
HDL: 37 (ref 35–70)
LDL Cholesterol: 42
Triglycerides: 186 — AB (ref 40–160)

## 2023-12-21 LAB — HEMOGLOBIN A1C: Hemoglobin A1C: 6.5

## 2023-12-21 LAB — BASIC METABOLIC PANEL WITH GFR: Glucose: 90

## 2024-01-06 ENCOUNTER — Encounter: Payer: Self-pay | Admitting: Nurse Practitioner

## 2024-01-06 ENCOUNTER — Ambulatory Visit: Admitting: Nurse Practitioner

## 2024-01-06 VITALS — BP 116/68 | HR 79 | Temp 98.5°F | Ht 68.5 in | Wt 240.2 lb

## 2024-01-06 DIAGNOSIS — R251 Tremor, unspecified: Secondary | ICD-10-CM | POA: Diagnosis not present

## 2024-01-06 DIAGNOSIS — I10 Essential (primary) hypertension: Secondary | ICD-10-CM

## 2024-01-06 DIAGNOSIS — E119 Type 2 diabetes mellitus without complications: Secondary | ICD-10-CM | POA: Diagnosis not present

## 2024-01-06 DIAGNOSIS — Z794 Long term (current) use of insulin: Secondary | ICD-10-CM

## 2024-01-06 DIAGNOSIS — F411 Generalized anxiety disorder: Secondary | ICD-10-CM

## 2024-01-06 DIAGNOSIS — E66812 Obesity, class 2: Secondary | ICD-10-CM

## 2024-01-06 DIAGNOSIS — E782 Mixed hyperlipidemia: Secondary | ICD-10-CM

## 2024-01-06 DIAGNOSIS — Z6835 Body mass index (BMI) 35.0-35.9, adult: Secondary | ICD-10-CM

## 2024-01-06 MED ORDER — TIRZEPATIDE 2.5 MG/0.5ML ~~LOC~~ SOAJ: 2.5000 mg | SUBCUTANEOUS | 0 refills | Status: DC

## 2024-01-06 MED ORDER — PIOGLITAZONE HCL 30 MG PO TABS: 30.0000 mg | ORAL_TABLET | Freq: Every day | ORAL | 3 refills | Status: DC

## 2024-01-06 MED ORDER — LISINOPRIL 5 MG PO TABS: 5.0000 mg | ORAL_TABLET | Freq: Every day | ORAL | 3 refills | Status: DC

## 2024-01-06 MED ORDER — TIRZEPATIDE 5 MG/0.5ML ~~LOC~~ SOAJ: 5.0000 mg | SUBCUTANEOUS | 0 refills | Status: DC

## 2024-01-06 MED ORDER — FREESTYLE LIBRE 3 PLUS SENSOR MISC: 11 refills | Status: DC

## 2024-01-06 NOTE — Progress Notes (Signed)
 [name], NP-C Phone: [phone]  Ryan Dickerson is a 63 y.o. male who presents today to establish care.   Discussed the use of AI scribe software for clinical note transcription with the patient, who gave verbal consent to proceed.  History of Present Illness   Ryan Dickerson is a 64 year old male who presents to establish care with tremors and numbness in his toe.  He experiences tremors, particularly noticeable when eating, which affects his ability to keep food on his fork. He has not previously seen a neurologist for this issue.  He has numbness in the toe beside his big toe, which he associates with a past fall at [employer] where he believes he broke the toe. The numbness has been improving over time.  He has a history of diabetes, managed with metformin 1000 mg twice daily, Jardiance 25 mg daily, and Toujeo insulin, typically 40-45 units at night, sometimes increasing to 50 units if he eats more. He checks his blood sugar once daily, usually at night, and reports an average below 140 mg/dL. 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  He has hypertension, for which he takes lisinopril , and reports his blood pressure readings have been consistently good. He was not initially aware of his hypertension diagnosis.  He takes atorvastatin for hyperlipidemia, which he notes fluctuates but has not required changes in medication.  He experiences erectile dysfunction and has tried medications like Viagra and Cialis, which resulted in headaches without significant improvement in symptoms.  He has a history of anxiety and takes clonazepam as needed, usually once or twice a day, despite being prescribed three times daily.  He recalls an episode of dizziness and sweating about six months ago, which resolved after lying down. He has not had any significant cardiac issues and had a normal stress test approximately 8-9  years ago.  He quit smoking approximately 11 years ago when his wife became pregnant.      Social History   Tobacco Use  Smoking Status Former   Current packs/day: 0.00   Average packs/day: 0.5 packs/day for 30.0 years (15.0 ttl pk-yrs)   Types: Cigarettes   Start date: 04/29/1983   Quit date: 04/28/2013   Years since quitting: 10.7  Smokeless Tobacco Never    Current Outpatient Medications on File Prior to Visit  Medication Sig Dispense Refill   aspirin 81 MG tablet Take 81 mg by mouth daily.     atorvastatin (LIPITOR) 10 MG tablet Take 10 mg by mouth daily.     clonazePAM (KLONOPIN) 1 MG tablet Take 1 mg by mouth daily. (Patient taking differently: Take 1 mg by mouth daily.)     empagliflozin (JARDIANCE) 25 MG TABS tablet Take 25 mg by mouth daily.     Insulin Glargine (TOUJEO SOLOSTAR) 300 UNIT/ML SOPN Inject 45 Units into the skin at bedtime. (Patient taking differently: Inject 45 Units into the skin at bedtime.)     metFORMIN (GLUCOPHAGE) 500 MG tablet Take 1,000 mg by mouth 2 (two) times daily.     Omega-3 Fatty Acids (FISH OIL) 1000 MG CAPS Take 2,000 mg by mouth daily.     tiZANidine (ZANAFLEX) 4 MG tablet Take 4 mg by mouth once.     EPINEPHrine  0.3 mg/0.3 mL IJ SOAJ injection Inject 0.3 mg into the muscle as needed for anaphylaxis. 1 each 0   No current facility-administered medications on file prior to visit.  ROS see history of present illness  Objective  Physical Exam Vitals:   01/06/24 1313  BP: 116/68  Pulse: 79  Temp: 98.5 F (36.9 C)  SpO2: 98%    BP Readings from Last 3 Encounters:  01/06/24 116/68  10/20/23 (!) 151/81  02/17/23 (!) 129/56   Wt Readings from Last 3 Encounters:  01/06/24 240 lb 3.2 oz (109 kg)  10/20/23 235 lb (106.6 kg)  02/16/23 240 lb (108.9 kg)    Physical Exam Constitutional:      General: He is not in acute distress.    Appearance: Normal appearance.  HENT:     Head: Normocephalic.  Cardiovascular:     Rate and  Rhythm: Normal rate and regular rhythm.     Heart sounds: Normal heart sounds.  Pulmonary:     Effort: Pulmonary effort is normal.     Breath sounds: Normal breath sounds.  Skin:    General: Skin is warm and dry.  Neurological:     General: No focal deficit present.     Mental Status: He is alert.     Motor: Tremor present.  Psychiatric:        Mood and Affect: Mood normal.        Behavior: Behavior normal.      Assessment/Plan: Please see individual problem list.  Type 2 diabetes mellitus without complication, with long-term current use of insulin (HCC) Assessment & Plan: Type 2 diabetes mellitus is well-controlled with an A1c of 6.5%. Mounjaro  was discussed for glycemic control and weight management, including potential side effects and medication adjustments. Start Mounjaro  2.5 mg once weekly for 4 weeks, then increase to 5 mg once weekly. Counseled on the risk of pancreatitis and gallbladder disease. Discussed the risk of nausea. Advised to discontinue the Mounjaro  and contact us  immediately if they develop abdominal pain. If they develop excessive nausea they will contact us  right away. I discussed that medullary thyroid cancer has been seen in rats studies. The patient confirmed no personal or family history of thyroid cancer, parathyroid cancer, or adrenal gland cancer. Discussed that we thus far have not seen medullary thyroid cancer result from use of this type of medication in humans. Advised to monitor the thyroid area and contact us  for any lumps, swelling, trouble swallowing, or any other changes in this area. Discussed the importance of healthy diet, exercise and lifestyle modifications even with this medication. Order a Freestyle sensor for continuous glucose monitoring. Plan to decrease Toujeo, metformin, and Actos  as Mounjaro  takes effect. Continue Jardiance for renal and cardiac protection. Follow up in 6 weeks to assess response to Mounjaro .  Orders: -     FreeStyle Libre  3 Plus Sensor; Use as directed to monitor blood glucose. Change sensor every 15 days.  Dispense: 2 each; Refill: 11 -     Pioglitazone  HCl; Take 1 tablet (30 mg total) by mouth daily.  Dispense: 90 tablet; Refill: 3 -     Tirzepatide ; Inject 5 mg into the skin once a week.  Dispense: 2 mL; Refill: 0  Tremor of both hands Assessment & Plan: Bilateral hand tremor affects daily activities. Right hand worse than left. Management with beta blockers was discussed, and a neurological evaluation is needed. Refer to neurology for further evaluation and management of tremor.  Orders: -     Ambulatory referral to Neurology  Class 2 severe obesity due to excess calories with serious comorbidity and body mass index (BMI) of 35.0 to 35.9 in adult Assessment &  Plan: Obesity with a goal to lose 50 pounds. Mounjaro 's role in weight loss was discussed, along with encouragement for exercise and healthy eating. Start Mounjaro  as part of diabetes management, which may aid in weight loss. Encourage weight-bearing exercise and healthy eating habits.  Orders: -     Tirzepatide ; Inject 5 mg into the skin once a week.  Dispense: 2 mL; Refill: 0  Generalized anxiety disorder Assessment & Plan: Generalized anxiety disorder is managed with clonazepam 1 mg three times daily as needed. Patient notes he has been on this medication for several years and symptoms are well controlled. Counseled on adverse effects of long term use. Patient wishes to continue clonazepam as needed for anxiety. PDMP reviewed. He will contact when refill is needed.    Mixed hyperlipidemia Assessment & Plan: Mixed hyperlipidemia is managed with atorvastatin. Continue atorvastatin as prescribed.   Essential hypertension Assessment & Plan: Essential hypertension is well-controlled on lisinopril . Continue lisinopril  as prescribed.  Orders: -     Lisinopril ; Take 1 tablet (5 mg total) by mouth daily.  Dispense: 90 tablet; Refill:  3     Return in about 6 weeks (around 02/17/2024) for Follow up.   [name], NP-C [facility]

## 2024-01-07 ENCOUNTER — Telehealth: Payer: Self-pay

## 2024-01-07 DIAGNOSIS — Z794 Long term (current) use of insulin: Secondary | ICD-10-CM

## 2024-01-07 NOTE — Telephone Encounter (Signed)
 Copied from CRM [id number]. Topic: Clinical - Medication Question >> Jan 07, 2024 11:38 AM Ryan Dickerson DEL wrote: Reason for CRM: Patient would like to know if he is still suppose to  taking the Insulin Glargine (TOUJEO SOLOSTAR) 300 UNIT/ML SOPN  with the tirzepatide  (MOUNJARO ) 2.5 MG/0.5ML Pen that he was prescribed yesterday? Please advise.

## 2024-01-07 NOTE — Telephone Encounter (Signed)
Pt informed/verbalized understanding

## 2024-01-08 MED ORDER — TIRZEPATIDE 2.5 MG/0.5ML ~~LOC~~ SOAJ: 2.5000 mg | SUBCUTANEOUS | 0 refills | Status: DC

## 2024-01-08 NOTE — Telephone Encounter (Signed)
 Called and informed pt as well as mychart sent

## 2024-01-08 NOTE — Telephone Encounter (Signed)
 Copied from CRM [id number]. Topic: Clinical - Prescription Issue >> Jan 08, 2024 10:41 AM [place] J wrote: Reason for CRM: The pharmacy that the patient's tirzepatide  (MOUNJARO ) 5 MG/0.5ML Pen & tirzepatide  (MOUNJARO ) 2.5 MG/0.5ML Pen were sent to on the 10th does not have the medication in stock. The patient was wondering if his medication could be sent to [facility] instead.  [facility] [address] [place] - [place], [place] - [address] AT [address] [place] [address] Phone: [phone] Fax: [phone] Hours: Not open 24 hours  Patient is aware of the same day turn around time and would like a call back once completed.

## 2024-01-25 ENCOUNTER — Encounter: Payer: Self-pay | Admitting: Nurse Practitioner

## 2024-01-25 ENCOUNTER — Other Ambulatory Visit: Payer: Self-pay | Admitting: Nurse Practitioner

## 2024-01-25 DIAGNOSIS — F411 Generalized anxiety disorder: Secondary | ICD-10-CM | POA: Insufficient documentation

## 2024-01-25 DIAGNOSIS — R251 Tremor, unspecified: Secondary | ICD-10-CM | POA: Insufficient documentation

## 2024-01-25 DIAGNOSIS — E119 Type 2 diabetes mellitus without complications: Secondary | ICD-10-CM

## 2024-01-25 NOTE — Assessment & Plan Note (Signed)
 Generalized anxiety disorder is managed with clonazepam 1 mg three times daily as needed. Patient notes he has been on this medication for several years and symptoms are well controlled. Counseled on adverse effects of long term use. Patient wishes to continue clonazepam as needed for anxiety. PDMP reviewed. He will contact when refill is needed.

## 2024-01-25 NOTE — Assessment & Plan Note (Signed)
 Obesity with a goal to lose 50 pounds. Mounjaro 's role in weight loss was discussed, along with encouragement for exercise and healthy eating. Start Mounjaro  as part of diabetes management, which may aid in weight loss. Encourage weight-bearing exercise and healthy eating habits.

## 2024-01-25 NOTE — Telephone Encounter (Unsigned)
 Copied from CRM #[id number]. Topic: Clinical - Medication Refill >> Jan 25, 2024  1:58 PM Mia F wrote: Medication: tirzepatide  (MOUNJARO ) 5 MG/0.5ML Pen  Has the patient contacted their pharmacy? No (Agent: If no, request that the patient contact the pharmacy for the refill. If patient does not wish to contact the pharmacy document the reason why and proceed with request.) (Agent: If yes, when and what did the pharmacy advise?)  This is the patient's preferred pharmacy:   [facility] [address] - [place], [place] - [address] [facility] AT [facility] [address] [place] [address] Phone: [phone] Fax: [phone]  Is this the correct pharmacy for this prescription? Yes If no, delete pharmacy and type the correct one.   Has the prescription been filled recently? Yes  Is the patient out of the medication? No  Has the patient been seen for an appointment in the last year OR does the patient have an upcoming appointment? Yes  Can we respond through MyChart? Yes  Agent: Please be advised that Rx refills may take up to 3 business days. We ask that you follow-up with your pharmacy.

## 2024-01-25 NOTE — Assessment & Plan Note (Signed)
 Mixed hyperlipidemia is managed with atorvastatin. Continue atorvastatin as prescribed.

## 2024-01-25 NOTE — Telephone Encounter (Signed)
 Mychart msg has been fwded to provider in regards to this

## 2024-01-25 NOTE — Assessment & Plan Note (Signed)
 Bilateral hand tremor affects daily activities. Right hand worse than left. Management with beta blockers was discussed, and a neurological evaluation is needed. Refer to neurology for further evaluation and management of tremor.

## 2024-01-25 NOTE — Assessment & Plan Note (Signed)
 Essential hypertension is well-controlled on lisinopril . Continue lisinopril  as prescribed.

## 2024-01-25 NOTE — Assessment & Plan Note (Signed)
 Type 2 diabetes mellitus is well-controlled with an A1c of 6.5%. Mounjaro  was discussed for glycemic control and weight management, including potential side effects and medication adjustments. Start Mounjaro  2.5 mg once weekly for 4 weeks, then increase to 5 mg once weekly. Counseled on the risk of pancreatitis and gallbladder disease. Discussed the risk of nausea. Advised to discontinue the Mounjaro  and contact us  immediately if they develop abdominal pain. If they develop excessive nausea they will contact us  right away. I discussed that medullary thyroid cancer has been seen in rats studies. The patient confirmed no personal or family history of thyroid cancer, parathyroid cancer, or adrenal gland cancer. Discussed that we thus far have not seen medullary thyroid cancer result from use of this type of medication in humans. Advised to monitor the thyroid area and contact us  for any lumps, swelling, trouble swallowing, or any other changes in this area. Discussed the importance of healthy diet, exercise and lifestyle modifications even with this medication. Order a Freestyle sensor for continuous glucose monitoring. Plan to decrease Toujeo, metformin, and Actos  as Mounjaro  takes effect. Continue Jardiance for renal and cardiac protection. Follow up in 6 weeks to assess response to Mounjaro .

## 2024-01-25 NOTE — Telephone Encounter (Unsigned)
 Copied from CRM [id number]. Topic: Clinical - Medication Refill >> Jan 25, 2024 10:10 AM Harlene ORN wrote: Medication: tirzepatide  (MOUNJARO ) 5 MG/0.5ML Pen  Has the patient contacted their pharmacy? No (Agent: If no, request that the patient contact the pharmacy for the refill. If patient does not wish to contact the pharmacy document the reason why and proceed with request.) (Agent: If yes, when and what did the pharmacy advise?)  This is the patient's preferred pharmacy:   [facility] [address] [facility] - [place], [place] - [address] [facility] AT [facility] [address] [place] [address] Phone: [phone] Fax: [phone]  Is this the correct pharmacy for this prescription? Yes If no, delete pharmacy and type the correct one.   Has the prescription been filled recently? No  Is the patient out of the medication? No  Has the patient been seen for an appointment in the last year OR does the patient have an upcoming appointment? Yes  Can we respond through MyChart? No  Agent: Please be advised that Rx refills may take up to 3 business days. We ask that you follow-up with your pharmacy.

## 2024-01-27 MED ORDER — TIRZEPATIDE 5 MG/0.5ML ~~LOC~~ SOAJ: 5.0000 mg | SUBCUTANEOUS | 0 refills | Status: DC

## 2024-02-01 ENCOUNTER — Telehealth: Payer: Self-pay

## 2024-02-01 ENCOUNTER — Other Ambulatory Visit: Payer: Self-pay

## 2024-02-01 DIAGNOSIS — E119 Type 2 diabetes mellitus without complications: Secondary | ICD-10-CM

## 2024-02-01 MED ORDER — FREESTYLE LIBRE 3 PLUS SENSOR MISC: 11 refills | Status: AC

## 2024-02-01 NOTE — Telephone Encounter (Signed)
 Copied from CRM [id number]. Topic: Clinical - Medication Question >> Feb 01, 2024  3:26 PM Viola F wrote: Reason for CRM: Patient called to let [name] know that [facility] will be reaching out regarding the Continuous Glucose Sensor (FREESTYLE LIBRE 3 PLUS SENSOR) MISC [[id number]]

## 2024-02-01 NOTE — Telephone Encounter (Signed)
 Called and spoke to [facility] and they stated a new script had to be sent in due to the voucher being added to account. Refill has been sent in and pt has been informed

## 2024-02-01 NOTE — Telephone Encounter (Signed)
 Copied from CRM [id number]. Topic: General - Other >> Feb 01, 2024 10:38 AM Olam RAMAN wrote: Reason for CRM: PT got a voucher for a monitor and turned it in to [facility] and stated they need an RX Lebrae diabetes

## 2024-02-02 NOTE — Telephone Encounter (Signed)
 Called pt and informed him we have not received anything yet and asked if he knows exactly what is needed or when they will be reaching out opt did not know he stated they just said they would reach out to our office but nothing has been received as of yet. Asked pt if he was able to pick up the freestyle libre sensors at the pharmacy and he stated he has not been by but he will reach out.

## 2024-03-01 ENCOUNTER — Other Ambulatory Visit: Payer: Self-pay | Admitting: Nurse Practitioner

## 2024-03-01 ENCOUNTER — Telehealth: Payer: Self-pay | Admitting: Nurse Practitioner

## 2024-03-01 DIAGNOSIS — Z6835 Body mass index (BMI) 35.0-35.9, adult: Secondary | ICD-10-CM

## 2024-03-01 DIAGNOSIS — Z794 Long term (current) use of insulin: Secondary | ICD-10-CM

## 2024-03-01 DIAGNOSIS — E119 Type 2 diabetes mellitus without complications: Secondary | ICD-10-CM

## 2024-03-01 DIAGNOSIS — E66812 Obesity, class 2: Secondary | ICD-10-CM

## 2024-03-01 MED ORDER — PEN NEEDLES 31G X 8 MM MISC: 3 refills | Status: DC

## 2024-03-01 MED ORDER — TIRZEPATIDE 5 MG/0.5ML ~~LOC~~ SOAJ: 5.0000 mg | SUBCUTANEOUS | 0 refills | Status: DC

## 2024-03-01 NOTE — Telephone Encounter (Unsigned)
 Copied from CRM [id number]. Topic: Clinical - Medication Refill >> Mar 01, 2024 11:13 AM China J wrote: Medication: Insulin Needles  Has the patient contacted their pharmacy? No (Agent: If no, request that the patient contact the pharmacy for the refill. If patient does not wish to contact the pharmacy document the reason why and proceed with request.) (Agent: If yes, when and what did the pharmacy advise?) The patient was already discussing a separate request before requesting this refill.  This is the patient's preferred pharmacy:  [facility] - [place], [place] - [address] [place] [address] Phone: [phone] Fax: [phone]  Is this the correct pharmacy for this prescription? Yes If no, delete pharmacy and type the correct one.   Has the prescription been filled recently? No  Is the patient out of the medication? Yes  Has the patient been seen for an appointment in the last year OR does the patient have an upcoming appointment? Yes  Can we respond through MyChart? Yes  Agent: Please be advised that Rx refills may take up to 3 business days. We ask that you follow-up with your pharmacy.

## 2024-03-01 NOTE — Telephone Encounter (Unsigned)
 Copied from CRM [id number]. Topic: Clinical - Medication Refill >> Mar 01, 2024 11:08 AM China J wrote: Medication:  tirzepatide  (MOUNJARO ) 5 MG/0.5ML Pen  Has the patient contacted their pharmacy? No (Agent: If no, request that the patient contact the pharmacy for the refill. If patient does not wish to contact the pharmacy document the reason why and proceed with request.) (Agent: If yes, when and what did the pharmacy advise?) The patient is needing his script adjusted.  This is the patient's preferred pharmacy:  [facility] [address] - [place], [place] - [address] [facility] AT [facility] [address] [place] [address] Phone: [phone] Fax: [phone]  Is this the correct pharmacy for this prescription? Yes If no, delete pharmacy and type the correct one.   Has the prescription been filled recently? No  Is the patient out of the medication? Yes  Has the patient been seen for an appointment in the last year OR does the patient have an upcoming appointment? Yes  Can we respond through MyChart? Yes  Agent: Please be advised that Rx refills may take up to 3 business days. We ask that you follow-up with your pharmacy.

## 2024-03-02 ENCOUNTER — Other Ambulatory Visit: Payer: Self-pay | Admitting: Nurse Practitioner

## 2024-03-02 DIAGNOSIS — E66812 Obesity, class 2: Secondary | ICD-10-CM

## 2024-03-02 DIAGNOSIS — Z794 Long term (current) use of insulin: Secondary | ICD-10-CM

## 2024-03-02 MED ORDER — PEN NEEDLES 31G X 8 MM MISC: 3 refills | Status: AC

## 2024-03-02 NOTE — Telephone Encounter (Signed)
 Copied from CRM [id number]. Topic: Clinical - Prescription Issue >> Mar 02, 2024  9:20 AM Berneda FALCON wrote: Reason for CRM: Patient called in yesterday (11/4) for refills but they were both sent to the wrong place.  Patient is requesting the 7 MG dosage for his tirzepatide  (MOUNJARO ) 5 MG/0.5ML Pen and was supposed to get a call back to see if Leron would approve this dosage for him since he was already on the 5MG . Additionally, he is requesting this to be sent to the following pharmacy, but it was sent to [facility] (with the 5MG  dose):  [facility] [address] [facility] - [place], [place] - [address] [facility] AT [facility] [address] [place] [address] Phone: [phone] Fax: [phone] Hours: Not open 24 hours  Additionally, [facility] insulin pen [facility], and he requested these be sent to the following pharmacy for a 90-day supply Insulin Pen Needle (PEN NEEDLES) 31G X 8 MM MISC:  [facility] - [place], [place] - [address] [place] [address] Phone: [phone] Fax: [phone] Hours: Not open 24 hours   Please call patient back with updates on these med refills-[phone] >> Mar 02, 2024 11:30 AM China J wrote: The patient is calling back to ensure that his mounjaro  is sent to [facility] and his needles are sent to [facility]. He would like a phone call from us  once the switch has been made.

## 2024-03-02 NOTE — Telephone Encounter (Signed)
Pt informed

## 2024-03-02 NOTE — Telephone Encounter (Signed)
 Copied from CRM [id number]. Topic: Clinical - Prescription Issue >> Mar 02, 2024  9:20 AM Berneda FALCON wrote: Reason for CRM: Patient called in yesterday (11/4) for refills but they were both sent to the wrong place.  Patient is requesting the 7 MG dosage for his tirzepatide  (MOUNJARO ) 5 MG/0.5ML Pen and was supposed to get a call back to see if Leron would approve this dosage for him since he was already on the 5MG . Additionally, he is requesting this to be sent to the following pharmacy, but it was sent to [facility] (with the 5MG  dose):  [facility] [address] - [place], [place] - [address] [facility] AT [facility] [address] [place] [address] Phone: [phone] Fax: [phone] Hours: Not open 24 hours  Additionally, [facility] insulin [facility], and he requested these be sent to the following pharmacy for a 90-day supply Insulin Pen Needle (PEN NEEDLES) 31G X 8 MM MISC:  [facility] - [place], [place] - [address] [place] [address] Phone: [phone] Fax: [phone] Hours: Not open 24 hours   Please call patient back with updates on these med refills-[phone]

## 2024-03-02 NOTE — Telephone Encounter (Signed)
Pt already informed

## 2024-03-04 MED ORDER — TIRZEPATIDE 5 MG/0.5ML ~~LOC~~ SOAJ: 5.0000 mg | SUBCUTANEOUS | 0 refills | Status: DC

## 2024-03-04 NOTE — Addendum Note (Signed)
 Addended by: ORLANDO KINGDOM on: 03/04/2024 10:17 AM   Modules accepted: Orders

## 2024-03-04 NOTE — Telephone Encounter (Unsigned)
 Copied from CRM #[id number]. Topic: Clinical - Prescription Issue >> Mar 04, 2024 10:05 AM Winona SAUNDERS wrote: [facility] calling to request a refill tirzepatide  (MOUNJARO ) 5 MG/0.5ML Pen be sent to them. [facility]

## 2024-03-04 NOTE — Telephone Encounter (Unsigned)
 Copied from CRM [id number]. Topic: Clinical - Prescription Issue >> Mar 04, 2024  9:57 AM Thersia BROCKS wrote: Reason for CRM: Patient calling in regarding prescription  tirzepatide  (MOUNJARO ) 5 MG/0.5ML Pen Stated that the pharmacy that he needs to be sent to  [facility] [address] - [place], [place] - [address] [facility] AT [facility] [address] [place] [address] Phone: [phone] Fax: [phone]  Stated he has called [facility] twice and they have still not received     Would like someone to call him back regarding this

## 2024-03-04 NOTE — Telephone Encounter (Signed)
 sent

## 2024-03-11 ENCOUNTER — Ambulatory Visit: Admitting: Nurse Practitioner

## 2024-03-11 ENCOUNTER — Encounter: Payer: Self-pay | Admitting: Nurse Practitioner

## 2024-03-11 VITALS — BP 104/66 | HR 76 | Temp 97.8°F | Ht 68.5 in | Wt 226.0 lb

## 2024-03-11 DIAGNOSIS — Z6835 Body mass index (BMI) 35.0-35.9, adult: Secondary | ICD-10-CM

## 2024-03-11 DIAGNOSIS — E66812 Obesity, class 2: Secondary | ICD-10-CM

## 2024-03-11 DIAGNOSIS — Z794 Long term (current) use of insulin: Secondary | ICD-10-CM | POA: Diagnosis not present

## 2024-03-11 DIAGNOSIS — E119 Type 2 diabetes mellitus without complications: Secondary | ICD-10-CM

## 2024-03-11 MED ORDER — METFORMIN HCL 500 MG PO TABS: 500.0000 mg | ORAL_TABLET | Freq: Two times a day (BID) | ORAL | Status: DC

## 2024-03-11 MED ORDER — TIRZEPATIDE 7.5 MG/0.5ML ~~LOC~~ SOAJ: 7.5000 mg | SUBCUTANEOUS | 2 refills | Status: DC

## 2024-03-11 MED ORDER — METFORMIN HCL 500 MG PO TABS: 500.0000 mg | ORAL_TABLET | Freq: Two times a day (BID) | ORAL | 3 refills | Status: DC

## 2024-03-11 NOTE — Progress Notes (Signed)
 [name], NP-C Phone: [phone]  Ryan Dickerson is a 63 y.o. male who presents today for follow up.   Discussed the use of AI scribe software for clinical note transcription with the patient, who gave verbal consent to proceed.  History of Present Illness   Ryan Dickerson is a 63 year old male with diabetes who presents for follow-up on Mounjaro  treatment and blood sugar management.  He has been on Mounjaro  for about eight weeks and experiences both constipation and diarrhea. There is a decrease in appetite, and bowel movements are infrequent. He is currently taking 5 mg of Mounjaro , administered on Sundays. He has faced issues with pharmacy coordination for his medication and pen needles, requiring multiple calls and visits to different pharmacies.  His fourteen-day blood sugar average is ninety-seven and his thirty-day average is ninety-six. He experiences low blood sugar levels, particularly at night, which he attributes to lying on the sensor. He sometimes turns off the sensor alarms at night due to disturbances. He is currently taking 35 units of Toujeo nightly and exercises by walking two miles three to four times a week.  He is taking 2000 mg of metformin  daily and wants to reduce this dosage. He also takes Jardiance and has adjusted his diet to avoid caffeine. He drinks about four bottles of water daily and consumes Sprite Zero. He has experienced a weight plateau at 226 pounds and reports feeling hungry despite the medication.  His Freestyle sensor costs $75 a month due to not meeting his deductible, which is a financial concern. He has been using coupons to manage costs. His A1c was last checked in August and is due for another check soon.      Social History   Tobacco Use  Smoking Status Former   Current packs/day: 0.00   Average packs/day: 0.5 packs/day for 30.0 years (15.0 ttl pk-yrs)   Types: Cigarettes   Start date: 04/29/1983   Quit date: 04/28/2013   Years since  quitting: 10.9  Smokeless Tobacco Never    Current Outpatient Medications on File Prior to Visit  Medication Sig Dispense Refill   aspirin 81 MG tablet Take 81 mg by mouth daily.     atorvastatin (LIPITOR) 10 MG tablet Take 10 mg by mouth daily.     clonazePAM (KLONOPIN) 1 MG tablet Take 1 mg by mouth daily. (Patient taking differently: Take 1 mg by mouth daily.)     Continuous Glucose Sensor (FREESTYLE LIBRE 3 PLUS SENSOR) MISC Use as directed to monitor blood glucose. Change sensor every 15 days. 2 each 11   empagliflozin (JARDIANCE) 25 MG TABS tablet Take 25 mg by mouth daily.     EPINEPHrine  0.3 mg/0.3 mL IJ SOAJ injection Inject 0.3 mg into the muscle as needed for anaphylaxis. 1 each 0   Insulin Glargine (TOUJEO SOLOSTAR) 300 UNIT/ML SOPN Inject 45 Units into the skin at bedtime. (Patient taking differently: Inject 45 Units into the skin at bedtime.)     Insulin Pen Needle (PEN NEEDLES) 31G X 8 MM MISC Dispense based on patient and insurance preference. Use once daily as directed to inject insulin. (FOR ICD-10 E10.9, E11.9). 100 each 3   lisinopril  (ZESTRIL ) 5 MG tablet Take 1 tablet (5 mg total) by mouth daily. 90 tablet 3   Omega-3 Fatty Acids (FISH OIL) 1000 MG CAPS Take 2,000 mg by mouth daily.     pioglitazone  (ACTOS ) 30 MG tablet Take 1 tablet (30 mg total) by mouth daily. 90 tablet 3  tiZANidine (ZANAFLEX) 4 MG tablet Take 4 mg by mouth once.     No current facility-administered medications on file prior to visit.     ROS see history of present illness  Objective  Physical Exam Vitals:   03/11/24 0806  BP: 104/66  Pulse: 76  Temp: 97.8 F (36.6 C)  SpO2: 97%    BP Readings from Last 3 Encounters:  03/11/24 104/66  01/06/24 116/68  10/20/23 (!) 151/81   Wt Readings from Last 3 Encounters:  03/11/24 226 lb (102.5 kg)  01/06/24 240 lb 3.2 oz (109 kg)  10/20/23 235 lb (106.6 kg)    Physical Exam Constitutional:      General: He is not in acute distress.     Appearance: Normal appearance.  HENT:     Head: Normocephalic.  Cardiovascular:     Rate and Rhythm: Normal rate and regular rhythm.     Heart sounds: Normal heart sounds.  Pulmonary:     Effort: Pulmonary effort is normal.     Breath sounds: Normal breath sounds.  Skin:    General: Skin is warm and dry.  Neurological:     General: No focal deficit present.     Mental Status: He is alert.  Psychiatric:        Mood and Affect: Mood normal.        Behavior: Behavior normal.      Assessment/Plan: Please see individual problem list.  Type 2 diabetes mellitus without complication, with long-term current use of insulin (HCC) Assessment & Plan: Blood glucose is well-controlled with an average of 96 mg/dL. Currently on Mounjaro , Toujeo, Jardiance, Actos  and Metformin . Nocturnal hypoglycemia is likely due to Toujeo. Metformin  and Toujeo doses are being considered for reduction due to hypoglycemia and weight loss. Metformin  is reduced to 1000 mg daily, either 500 mg twice daily or 1000 mg once daily. Blood glucose levels will be monitored closely, especially at night. If hypoglycemia persists, consider reducing Toujeo dosage. An A1c test is scheduled for the end of November. Continue Mounjaro , Jardiance and Actos  as prescribed and monitor for changes in blood glucose levels. Encourage healthy diet and regular exercise.   Orders: -     Comprehensive metabolic panel with GFR; Future -     Hemoglobin A1c; Future -     Tirzepatide ; Inject 7.5 mg into the skin once a week.  Dispense: 2 mL; Refill: 2 -     metFORMIN  HCl; Take 1 tablet (500 mg total) by mouth 2 (two) times daily with a meal.  Dispense: 180 tablet; Refill: 3  Class 2 severe obesity due to excess calories with serious comorbidity and body mass index (BMI) of 35.0 to 35.9 in adult Assessment & Plan: Management with Mounjaro  has resulted in a 14-pound weight loss over eight weeks. Continue Mounjaro  at 5 mg until the next visit, then  increase to 7.5 mg. Ensure adequate hydration and fiber intake to manage constipation. Consider using Miralax or Colace for constipation management. Continued exercise and a healthy diet are encouraged.      Return in about 12 days (around 03/23/2024) for lab work, then in 6 weeks for follow up.   [name], NP-C [facility]

## 2024-03-21 ENCOUNTER — Encounter: Payer: Self-pay | Admitting: Nurse Practitioner

## 2024-03-21 NOTE — Assessment & Plan Note (Signed)
 Management with Mounjaro  has resulted in a 14-pound weight loss over eight weeks. Continue Mounjaro  at 5 mg until the next visit, then increase to 7.5 mg. Ensure adequate hydration and fiber intake to manage constipation. Consider using Miralax or Colace for constipation management. Continued exercise and a healthy diet are encouraged.

## 2024-03-21 NOTE — Assessment & Plan Note (Addendum)
 Blood glucose is well-controlled with an average of 96 mg/dL. Currently on Mounjaro , Toujeo, Jardiance, Actos  and Metformin . Nocturnal hypoglycemia is likely due to Toujeo. Metformin  and Toujeo doses are being considered for reduction due to hypoglycemia and weight loss. Metformin  is reduced to 1000 mg daily, either 500 mg twice daily or 1000 mg once daily. Blood glucose levels will be monitored closely, especially at night. If hypoglycemia persists, consider reducing Toujeo dosage. An A1c test is scheduled for the end of November. Continue Mounjaro , Jardiance and Actos  as prescribed and monitor for changes in blood glucose levels. Encourage healthy diet and regular exercise.

## 2024-03-22 ENCOUNTER — Other Ambulatory Visit (INDEPENDENT_AMBULATORY_CARE_PROVIDER_SITE_OTHER)

## 2024-03-22 ENCOUNTER — Other Ambulatory Visit: Payer: Self-pay | Admitting: Nurse Practitioner

## 2024-03-22 DIAGNOSIS — Z794 Long term (current) use of insulin: Secondary | ICD-10-CM

## 2024-03-22 DIAGNOSIS — E119 Type 2 diabetes mellitus without complications: Secondary | ICD-10-CM

## 2024-03-22 DIAGNOSIS — R7989 Other specified abnormal findings of blood chemistry: Secondary | ICD-10-CM

## 2024-03-23 LAB — COMPREHENSIVE METABOLIC PANEL WITH GFR
ALT: 11 IU/L (ref 0–44)
AST: 17 IU/L (ref 0–40)
Albumin: 4.8 g/dL (ref 3.9–4.9)
Alkaline Phosphatase: 78 IU/L (ref 47–123)
BUN/Creatinine Ratio: 14 (ref 10–24)
BUN: 15 mg/dL (ref 8–27)
Bilirubin Total: 0.6 mg/dL (ref 0.0–1.2)
CO2: 18 mmol/L — ABNORMAL LOW (ref 20–29)
Calcium: 9.6 mg/dL (ref 8.6–10.2)
Chloride: 106 mmol/L (ref 96–106)
Creatinine, Ser: 1.05 mg/dL (ref 0.76–1.27)
Globulin, Total: 2.2 g/dL (ref 1.5–4.5)
Glucose: 74 mg/dL (ref 70–99)
Potassium: 5 mmol/L (ref 3.5–5.2)
Sodium: 143 mmol/L (ref 134–144)
Total Protein: 7 g/dL (ref 6.0–8.5)
eGFR: 79 mL/min/1.73 (ref >59)

## 2024-03-23 LAB — TESTOSTERONE

## 2024-03-23 LAB — HEMOGLOBIN A1C
Est. average glucose Bld gHb Est-mCnc: 120 mg/dL
Hgb A1c MFr Bld: 5.8 % — ABNORMAL HIGH (ref 4.8–5.6)

## 2024-03-24 LAB — SPECIMEN STATUS REPORT

## 2024-04-05 ENCOUNTER — Ambulatory Visit: Payer: Self-pay | Admitting: Nurse Practitioner

## 2024-04-11 ENCOUNTER — Other Ambulatory Visit: Payer: Self-pay | Admitting: Nurse Practitioner

## 2024-04-22 ENCOUNTER — Other Ambulatory Visit: Payer: Self-pay | Admitting: Nurse Practitioner

## 2024-04-22 DIAGNOSIS — E119 Type 2 diabetes mellitus without complications: Secondary | ICD-10-CM

## 2024-04-22 MED ORDER — EMPAGLIFLOZIN 25 MG PO TABS: 25.0000 mg | ORAL_TABLET | Freq: Every day | ORAL | 3 refills | Status: DC

## 2024-04-22 NOTE — Telephone Encounter (Signed)
 Copied from CRM [id number]. Topic: Clinical - Medication Question >> Apr 22, 2024  8:21 AM Ryan Dickerson wrote: Reason for CRM: Patient called in stating that he called in 2 weeks ago to get a refill on his  empagliflozin  (JARDIANCE ) 25 MG TABS tablet. It looks like the request was never sent back. Patient is completely out of the medication and would like to know if there are any samples at the office for him to have,until his medication is delivered ?

## 2024-04-22 NOTE — Telephone Encounter (Signed)
 Copied from CRM [id number]. Topic: Clinical - Medication Refill >> Apr 22, 2024  8:20 AM Burnard DEL wrote: Medication: empagliflozin  (JARDIANCE ) 25 MG TABS tablet  Has the patient contacted their pharmacy? Yes (Agent: If no, request that the patient contact the pharmacy for the refill. If patient does not wish to contact the pharmacy document the reason why and proceed with request.) (Agent: If yes, when and what did the pharmacy advise?)  This is the patient's preferred pharmacy:    [facility] - [place], [place] - [address] [place] [address] Phone: [phone] Fax: [phone]    Is this the correct pharmacy for this prescription? Yes If no, delete pharmacy and type the correct one.   Has the prescription been filled recently? No  Is the patient out of the medication? Yes  Has the patient been seen for an appointment in the last year OR does the patient have an upcoming appointment? Yes  Can we respond through MyChart? Yes  Agent: Please be advised that Rx refills may take up to 3 business days. We ask that you follow-up with your pharmacy.

## 2024-04-22 NOTE — Addendum Note (Signed)
 Addended by: ORLANDO KINGDOM on: 04/22/2024 11:12 AM   Modules accepted: Orders

## 2024-04-22 NOTE — Addendum Note (Signed)
 Addended by: GRETEL APP on: 04/22/2024 12:50 PM   Modules accepted: Orders

## 2024-04-22 NOTE — Telephone Encounter (Unsigned)
 Copied from CRM #[id number]. Topic: Clinical - Prescription Issue >> Apr 22, 2024 12:27 PM Ryan Dickerson wrote: Reason for CRM: Patient called and said he is completely out of his medication: empagliflozin  (JARDIANCE ) 25 MG TABS tablet [[id number]]. Requesting we send it this one time to [facility] [id number] [place], [place] - [address] AT [facility] [address] [facility] [address] [phone] [address] [place] [address]. -- Patient is also requesting for provider nurse to call regarding samples.

## 2024-05-03 ENCOUNTER — Ambulatory Visit: Admitting: Nurse Practitioner

## 2024-05-03 ENCOUNTER — Encounter: Payer: Self-pay | Admitting: Nurse Practitioner

## 2024-05-03 VITALS — BP 114/75 | HR 89 | Temp 98.5°F | Ht 68.5 in | Wt 222.0 lb

## 2024-05-03 DIAGNOSIS — Z794 Long term (current) use of insulin: Secondary | ICD-10-CM

## 2024-05-03 DIAGNOSIS — E119 Type 2 diabetes mellitus without complications: Secondary | ICD-10-CM

## 2024-05-03 MED ORDER — METFORMIN HCL 500 MG PO TABS: 500.0000 mg | ORAL_TABLET | Freq: Every day | ORAL | Status: DC

## 2024-05-03 MED ORDER — TIRZEPATIDE 7.5 MG/0.5ML ~~LOC~~ SOAJ: 7.5000 mg | SUBCUTANEOUS | 1 refills | Status: AC

## 2024-05-03 NOTE — Progress Notes (Signed)
 " [name], NP-C Phone: [phone]  Ryan Dickerson is a 64 y.o. male who presents today for follow up.   Discussed the use of AI scribe software for clinical note transcription with the patient, who gave verbal consent to proceed.  History of Present Illness   Ryan Dickerson is a 64 year old male with diabetes who presents with gastrointestinal side effects from medication.  He experiences diarrhea starting Monday night after taking Mounjaro  on Sunday, lasting through Tuesday, and sometimes extending to Friday. He describes a 'copper' taste in his mouth a day or so after taking the medication, which he associates with the onset of diarrhea. These symptoms were present even on a lower dose of Mounjaro .  His blood sugar levels are stable, with an average around 96-97, and his last A1c was 5.8. He reports that his metformin  dosage was previously reduced from four to two tablets daily.  He has experienced issues with medication refills, particularly with Jardiance , leading to a week without the medication.     Tobacco Use History[1]  Medications Ordered Prior to Encounter[2]   ROS see history of present illness  Objective  Physical Exam Vitals:   05/03/24 1601  BP: 114/75  Pulse: 89  Temp: 98.5 F (36.9 C)  SpO2: 97%    BP Readings from Last 3 Encounters:  05/03/24 114/75  03/11/24 104/66  01/06/24 116/68   Wt Readings from Last 3 Encounters:  05/03/24 222 lb (100.7 kg)  03/11/24 226 lb (102.5 kg)  01/06/24 240 lb 3.2 oz (109 kg)    Physical Exam Constitutional:      General: He is not in acute distress.    Appearance: Normal appearance.  HENT:     Head: Normocephalic.  Cardiovascular:     Rate and Rhythm: Normal rate and regular rhythm.     Heart sounds: Normal heart sounds.  Pulmonary:     Effort: Pulmonary effort is normal.     Breath sounds: Normal breath sounds.  Skin:    General: Skin is warm and dry.  Neurological:     General: No focal  deficit present.     Mental Status: He is alert.  Psychiatric:        Mood and Affect: Mood normal.        Behavior: Behavior normal.      Assessment/Plan: Please see individual problem list.  Type 2 diabetes mellitus without complication, with long-term current use of insulin (HCC) Assessment & Plan: Diabetes is well-controlled with an A1c of 5.8% and average glucose of 96 mg/dL. Mounjaro  causes diarrhea and a copper taste but is tolerated for weight loss. Continue Mounjaro  7.5 mg weekly and fiber supplementation with Metamucil gummies. Monitor symptoms and adjust Mounjaro  dosage if necessary. Discontinued Actos  and reduced metformin  to once daily. Continue Jardiance  daily and Toujeo at 35 units, adjusting based on glucose levels. Encourage healthy diet and regular exercise. We will continue to monitor.   Orders: -     Tirzepatide ; Inject 7.5 mg into the skin once a week.  Dispense: 2 mL; Refill: 1 -     metFORMIN  HCl; Take 1 tablet (500 mg total) by mouth daily with breakfast.     Return in about 7 weeks (around 06/22/2024) for Follow up.   [name], NP-C [facility] [place]     [1]  Social History Tobacco Use  Smoking Status Former   Current packs/day: 0.00   Average packs/day: 0.5 packs/day for 30.0 years (15.0  ttl pk-yrs)   Types: Cigarettes   Start date: 04/29/1983   Quit date: 04/28/2013   Years since quitting: 11.0  Smokeless Tobacco Never  [2]  Current Outpatient Medications on File Prior to Visit  Medication Sig Dispense Refill   aspirin 81 MG tablet Take 81 mg by mouth daily.     atorvastatin (LIPITOR) 10 MG tablet Take 10 mg by mouth daily.     clonazePAM (KLONOPIN) 1 MG tablet Take 1 mg by mouth daily.     Continuous Glucose Sensor (FREESTYLE LIBRE 3 PLUS SENSOR) MISC Use as directed to monitor blood glucose. Change sensor every 15 days. 2 each 11   empagliflozin  (JARDIANCE ) 25 MG TABS tablet Take 1 tablet (25 mg total) by mouth daily.  90 tablet 3   EPINEPHrine  0.3 mg/0.3 mL IJ SOAJ injection Inject 0.3 mg into the muscle as needed for anaphylaxis. 1 each 0   Insulin Glargine (TOUJEO SOLOSTAR) 300 UNIT/ML SOPN Inject 45 Units into the skin at bedtime. (Patient taking differently: Inject 40 Units into the skin at bedtime.)     Insulin Pen Needle (PEN NEEDLES) 31G X 8 MM MISC Dispense based on patient and insurance preference. Use once daily as directed to inject insulin. (FOR ICD-10 E10.9, E11.9). 100 each 3   lisinopril  (ZESTRIL ) 5 MG tablet Take 1 tablet (5 mg total) by mouth daily. 90 tablet 3   Omega-3 Fatty Acids (FISH OIL) 1000 MG CAPS Take 2,000 mg by mouth daily.     tiZANidine (ZANAFLEX) 4 MG tablet Take 4 mg by mouth once.     No current facility-administered medications on file prior to visit.   "

## 2024-05-03 NOTE — Assessment & Plan Note (Signed)
 Diabetes is well-controlled with an A1c of 5.8% and average glucose of 96 mg/dL. Mounjaro  causes diarrhea and a copper taste but is tolerated for weight loss. Continue Mounjaro  7.5 mg weekly and fiber supplementation with Metamucil gummies. Monitor symptoms and adjust Mounjaro  dosage if necessary. Discontinued Actos  and reduced metformin  to once daily. Continue Jardiance  daily and Toujeo at 35 units, adjusting based on glucose levels. Encourage healthy diet and regular exercise. We will continue to monitor.

## 2024-05-13 ENCOUNTER — Telehealth: Payer: Self-pay

## 2024-05-13 DIAGNOSIS — E119 Type 2 diabetes mellitus without complications: Secondary | ICD-10-CM

## 2024-05-13 DIAGNOSIS — I10 Essential (primary) hypertension: Secondary | ICD-10-CM

## 2024-05-13 MED ORDER — LISINOPRIL 5 MG PO TABS: 5.0000 mg | ORAL_TABLET | Freq: Every day | ORAL | 3 refills | Status: AC

## 2024-05-13 MED ORDER — METFORMIN HCL 500 MG PO TABS: 500.0000 mg | ORAL_TABLET | Freq: Every day | ORAL | 3 refills | Status: AC

## 2024-05-13 MED ORDER — EMPAGLIFLOZIN 25 MG PO TABS: 25.0000 mg | ORAL_TABLET | Freq: Every day | ORAL | 3 refills | Status: AC

## 2024-05-13 NOTE — Addendum Note (Signed)
 Addended by: ORLANDO KINGDOM on: 05/13/2024 11:35 AM   Modules accepted: Orders

## 2024-05-13 NOTE — Telephone Encounter (Signed)
 Noted scripts have been sent to [facility]

## 2024-05-13 NOTE — Telephone Encounter (Unsigned)
 Copied from CRM #[id number]. Topic: Clinical - Medication Question >> May 13, 2024 11:16 AM Larissa RAMAN wrote: Reason for CRM: Patient returning missed call regarding pharmacy. He states to keep all medications with [facility] expect [place] [facility]- [facility] and Mounjaro - [facility].

## 2024-05-13 NOTE — Telephone Encounter (Signed)
 Called pt to verify if he just wanted to use [facility] and if he wanted to remove [facility] pharmacy out completely as we received a refill request from [facility] and they stated the remainder of his script was to be filled at [facility]   E2C2 IT IS OKAY TO RELAY THIS AND ROUT E A MSG BACK WITH PTS RESPONSE

## 2024-06-09 ENCOUNTER — Ambulatory Visit: Admitting: Neurology

## 2024-06-22 ENCOUNTER — Ambulatory Visit: Admitting: Nurse Practitioner
